# Patient Record
Sex: Female | Born: 1985 | Race: White | Hispanic: No | Smoking: Current every day smoker
Health system: Southern US, Community
[De-identification: ages and names within clinical notes are randomized; demographics above are authoritative.]

## PROBLEM LIST (undated history)

## (undated) ENCOUNTER — Inpatient Hospital Stay (HOSPITAL_COMMUNITY): Payer: Self-pay

## (undated) DIAGNOSIS — F419 Anxiety disorder, unspecified: Secondary | ICD-10-CM

## (undated) DIAGNOSIS — M5430 Sciatica, unspecified side: Secondary | ICD-10-CM

## (undated) DIAGNOSIS — M549 Dorsalgia, unspecified: Secondary | ICD-10-CM

## (undated) DIAGNOSIS — F32A Depression, unspecified: Secondary | ICD-10-CM

## (undated) DIAGNOSIS — C539 Malignant neoplasm of cervix uteri, unspecified: Secondary | ICD-10-CM

## (undated) DIAGNOSIS — K5090 Crohn's disease, unspecified, without complications: Secondary | ICD-10-CM

## (undated) DIAGNOSIS — F329 Major depressive disorder, single episode, unspecified: Secondary | ICD-10-CM

## (undated) DIAGNOSIS — G8929 Other chronic pain: Secondary | ICD-10-CM

## (undated) HISTORY — PX: ABDOMINAL HYSTERECTOMY: SHX81

## (undated) HISTORY — PX: BUNIONECTOMY: SHX129

## (undated) HISTORY — PX: MOUTH SURGERY: SHX715

## (undated) HISTORY — PX: CHOLECYSTECTOMY: SHX55

## (undated) HISTORY — DX: Malignant neoplasm of cervix uteri, unspecified: C53.9

## (undated) HISTORY — PX: HAND SURGERY: SHX662

---

## 2004-06-01 ENCOUNTER — Inpatient Hospital Stay (HOSPITAL_COMMUNITY): Admission: EM | Admit: 2004-06-01 | Discharge: 2004-06-02 | Payer: Self-pay | Admitting: Emergency Medicine

## 2007-03-10 ENCOUNTER — Emergency Department (HOSPITAL_COMMUNITY): Admission: EM | Admit: 2007-03-10 | Discharge: 2007-03-10 | Payer: Self-pay | Admitting: Emergency Medicine

## 2007-04-02 ENCOUNTER — Ambulatory Visit: Payer: Self-pay | Admitting: Gastroenterology

## 2007-11-07 ENCOUNTER — Emergency Department (HOSPITAL_COMMUNITY): Admission: EM | Admit: 2007-11-07 | Discharge: 2007-11-07 | Payer: Self-pay | Admitting: Emergency Medicine

## 2007-12-24 ENCOUNTER — Emergency Department (HOSPITAL_COMMUNITY): Admission: EM | Admit: 2007-12-24 | Discharge: 2007-12-25 | Payer: Self-pay | Admitting: Emergency Medicine

## 2008-07-27 ENCOUNTER — Emergency Department (HOSPITAL_COMMUNITY): Admission: EM | Admit: 2008-07-27 | Discharge: 2008-07-27 | Payer: Self-pay | Admitting: Emergency Medicine

## 2008-10-02 ENCOUNTER — Emergency Department (HOSPITAL_COMMUNITY): Admission: EM | Admit: 2008-10-02 | Discharge: 2008-10-02 | Payer: Self-pay | Admitting: Emergency Medicine

## 2008-10-03 ENCOUNTER — Emergency Department (HOSPITAL_COMMUNITY): Admission: EM | Admit: 2008-10-03 | Discharge: 2008-10-03 | Payer: Self-pay | Admitting: Emergency Medicine

## 2009-05-22 ENCOUNTER — Encounter: Payer: Self-pay | Admitting: Gastroenterology

## 2010-03-13 NOTE — Letter (Signed)
Summary: DISABILITY CLAIM  DISABILITY CLAIM   Imported By: Diana Eves 05/22/2009 11:52:25  _____________________________________________________________________  External Attachment:    Type:   Image     Comment:   External Document

## 2010-05-19 LAB — SALICYLATE LEVEL: Salicylate Lvl: <4 mg/dL (ref 2.8–20.0)

## 2010-05-19 LAB — URINALYSIS, ROUTINE W REFLEX MICROSCOPIC
Bilirubin Urine: NEGATIVE
Ketones, ur: NEGATIVE mg/dL
Nitrite: NEGATIVE
Protein, ur: NEGATIVE mg/dL
Specific Gravity, Urine: <1.005 — ABNORMAL LOW (ref 1.005–1.030)
Urobilinogen, UA: 0.2 mg/dL (ref 0.0–1.0)

## 2010-05-19 LAB — BASIC METABOLIC PANEL
CO2: 25 mEq/L (ref 19–32)
Chloride: 109 mEq/L (ref 96–112)
Glucose, Bld: 80 mg/dL (ref 70–99)
Potassium: 3.2 mEq/L — ABNORMAL LOW (ref 3.5–5.1)
Sodium: 140 mEq/L (ref 135–145)

## 2010-05-19 LAB — HCG, QUANTITATIVE, PREGNANCY: hCG, Beta Chain, Quant, S: <2 m[IU]/mL (ref <5)

## 2010-05-19 LAB — URINE MICROSCOPIC-ADD ON

## 2010-05-19 LAB — CBC
MCHC: 34.9 g/dL (ref 30.0–36.0)
MCV: 91.4 fL (ref 78.0–100.0)

## 2010-05-19 LAB — DIFFERENTIAL
Basophils Absolute: 0 10*3/uL (ref 0.0–0.1)
Basophils Relative: 0 % (ref 0–1)
Eosinophils Relative: 1 % (ref 0–5)
Lymphocytes Relative: 28 % (ref 12–46)
Monocytes Absolute: 0.4 10*3/uL (ref 0.1–1.0)
Neutro Abs: 4.9 10*3/uL (ref 1.7–7.7)
Neutrophils Relative %: 66 % (ref 43–77)

## 2010-05-19 LAB — RAPID URINE DRUG SCREEN, HOSP PERFORMED: Cocaine: POSITIVE — AB

## 2010-06-26 NOTE — Assessment & Plan Note (Signed)
NAMESTEELE, LEDONNE               CHART#:  [id number]   DATE:  04/02/2007                       DOB:  [birth year]   REFERRING PHYSICIAN:  Ivery Quale, P.A. with Dr. Dione Booze.   REASON FOR CONSULTATION:  Abdominal pain and diarrhea.   HISTORY OF PRESENT ILLNESS:  The patient is a 25 year old female who has  had abdominal pain since before Christmas.  She was seen by OB/GYN and  per her report was diagnosed with endometriosis.  She was placed on  birth control pills approximately four months ago and has had no change  in her lower abdominal pain.  The abdominal pain can be associated with  lower leg cramping.  She has had a maximum temperature of 100 degrees  Fahrenheit.  The pain is a stabbing pain in her lower abdomen that does  not radiate.  She has had no formed stool since she had her oldest child  four years ago.  When she has the pain and goes to the bathroom, she  states that she has vaginal bleeding.  She is having some burning with  urination, but had a urinalysis in the emergency department in January  that showed no evidence of cystitis.  She denies any heartburn,  indigestion, or problems swallowing.  She states that her lower  abdominal pain is constant.  Her last visit to OB/GYN was two months  ago.  She says her appetite is decreased and she has lost 10 pounds over  the last two months.   PAST MEDICAL HISTORY:  1. Blepharitis.  2. Endometriosis and irregular menses.   PAST SURGICAL HISTORY:  1. Mouth surgery.  2. Cholecystectomy in May of 2007 because her gallbladder quit      working.   ALLERGIES:  PAXIL causes hallucinations.   MEDICATIONS:  1. Percocet as needed.  2. Doxycycline 100 mg b.i.d.  3. Sprintec daily.  4. Phenergan as needed.   SOCIAL HISTORY:  She has two children at 30 and 2 and she is married.  Her husband accompanies her today to the visit and remains in the room  with his sunglasses on.  She smokes.  She denies any alcohol use.  She  has occasionally used marijuana.   REVIEW OF SYSTEMS:  As per the HPI, otherwise all systems are negative.   PHYSICAL EXAMINATION:  VITAL SIGNS:  Weight 130 pounds, height 5 feet 8  inches, BMI 19.8 (slightly underweight), temperature 98.3, blood  pressure 130/80, pulse 72.  GENERAL:  She is in no apparent distress,  alert and oriented x4.  She has a look as if she is in pain, but does  not appear to be uncomfortable.  HEENT:  Normocephalic and atraumatic.  Pupils equal, round, and reactive to light.  Mouth; no oral lesions.  Sclerae anicteric. NECK:  Full range of motion with no lymphadenopathy.  LUNGS:  Clear to auscultation bilaterally.  CARDIOVASCULAR:  Regular  rhythm with no murmur.  ABDOMEN:  Bowel sounds are present, soft,  nondistended.  With deep palpation with the stethoscope she shows no  discomfort at all.  When examined with my hand, then she becomes  uncomfortable.  When asked if it hurt worse when I used the stethoscope  or my hand, she said the hand hurt worse even though the amount of  palpation was the  same.  She had no rebound or guarding. EXTREMITIES:  No cyanosis or edema. NEURO:  She has no focal neurologic deficit.   ASSESSMENT:  The patient is a 25 year old female with lower abdominal  pain associated with bowel irregularity.  She does have loose stool  frequently.  The differential diagnosis includes irritable bowel  syndrome, or bile salt induced diarrhea.  Her disease seems to be  complicated by endometriosis with a possibility of implants on her bowel  wall causing bowel irregularity and irritation.  Thank you for allowing  me to see this patient in consultation.   RECOMMENDATIONS:  1. She needs to follow up with Ernestina Penna, M.D. at the [facility] for her endometriosis.  I think a significant portion of her      abdominal pain is related to her GYN system.  She may have some      small component of irritable bowel.  2. She is to add  Bentyl 10 mg four times a day as well as Colestid 1      gram daily.  If after seven days her diarrhea persists, then she is      to add two tablets of Colestid.  3. She should call me in two weeks to let me know how she is doing.  4. She is given a prescription for Phenergan and asked to use it as      needed for nausea and vomiting.  5. She has an outpatient visit in six weeks.  We will readdress her      diarrhea and place her on a low fat diet if things are not      significantly improved.       Kassie Mends, M.D.  Electronically Signed     SM/MEDQ  D:  04/02/2007  T:  04/03/2007  Job:  [id number]   cc:   Ernestina Penna

## 2010-06-29 NOTE — Discharge Summary (Signed)
Katherine Mack, Katherine Mack NO.:  [id number]   MEDICAL RECORD NO.:  [id number]          PATIENT TYPE:  INP   LOCATION:  A303                          FACILITY:  [facility]   PHYSICIAN:  Osvaldo Shipper, MD     DATE OF BIRTH:  [birth year]   DATE OF ADMISSION:  06/01/2004  DATE OF DISCHARGE:  04/22/2006LH                                 DISCHARGE SUMMARY   Please note the patient left AGAINST MEDICAL ADVICE on April 22nd.   PRIMARY CARE PHYSICIAN:  Dr. Margo Common.   Please review the H&P dictated on April 21st for details regarding the  patient's presenting illness.   BRIEF HOSPITAL COURSE:  The patient was admitted with severe lower back  pain, some difficulty with passing urine, low grade fever, and elevated  white count. The patient underwent a CAT scan which showed edema in  bilateral pelvises. Her UA was also positive for a urinary tract infection.  The patient was started on antibiotics in the form of levofloxacin.  Initial  diagnosis of acute pyelonephritis was given to the patient. Other  differentials including musculoskeletal etiology were also entertained  because of her presenting history. The patient underwent x-rays of her  lumbosacral spine which do not show any acute abnormality.   On the second hospital day I saw the patient, her symptoms had improved  slightly. She was able to ambulate to the bathroom with some assistance. The  plan was to continue with the antibiotics and see that the patient improves.  She was having bilateral CVA tenderness. There was no evidence for any  radiculopathy in this patient. ESR was obtained which was 30. The plan with  this patient was to continue with her antibiotics and have her stay in the  hospital at least one more day to see if her symptoms resolve or not. If the  symptoms are not resolved, consider further testing to evaluate his back  pain. However, later in the course of the day the patient really wanted to  leave the  hospital. She was advised not to do so, however she did sign  against medical advice and left the hospital.   MEDICATIONS:  I did call in a prescription for levofloxacin for a total of 12 days to her  pharmacy, which is [facility]. I also called in a prescription for  Toradol for about three days for her pain.   If the patient has recurrence of her pain she needs to return to the  emergency department. Further discharge instruction could not be  communicated as the patient has left the hospital.      GK/MEDQ  D:  06/03/2004  T:  06/03/2004  Job:  [id number]   cc:   Wyvonnia Lora  [address]  [place]  [place] [address]  Fax: [phone]

## 2010-06-29 NOTE — H&P (Signed)
Katherine Mack, Katherine Mack           ACCOUNT NO.:  [id number]   MEDICAL RECORD NO.:  [id number]          PATIENT TYPE:  INP   LOCATION:  A303                          FACILITY:  [facility]   PHYSICIAN:  Osvaldo Shipper, MD     DATE OF BIRTH:  10/06/1985   DATE OF ADMISSION:  06/01/2004  DATE OF DISCHARGE:  LH                                HISTORY & PHYSICAL   PRIMARY CARE PHYSICIAN:  Dr. Wyvonnia Lora.   ADMISSION DIAGNOSIS:  Back pain since midnight.  Query pyelonephritis.  Query musculoskeletal.   CHIEF COMPLAINT:  Back pain.   HISTORY OF PRESENT ILLNESS:  Patient is a 25 year old Caucasian female with  no medical problems in the past, who presents with onset of back pain since  midnight.  Patient states that she was getting up from a squatting position  when she heard a 'pop' in the back.  She did not experience any acute pain  at that time.  She mentioned after a while, the patient was a 3/10 in  intensity.  She went to bed but could not sleep because of the discomfort in  the back.  The patient subsequently woke up in the morning with about 10/10  pain.  The pain was described as being sharp in nature.  It was present, and  she was pointing to both her flanks with radiating down to the hip,  according to the patient.  The pain is aggravated by deep breathing,  coughing, and any kind of movement.  It is partially relieved with rest, and  the pain medication that she has been getting.  The patient also describes  some pain with urination.  She did not mention any blood in her urine.  She  did not mention any change in the color of her urine.  No change in the  smell of her urine either.  Patient denies any fever at home.  The patient  has never experienced similar symptoms in the past.  She did have some  nausea and had one episode of emesis this morning.  There is no history of  any abdominal pain; however, the patient did mention that she was having her  periods at this time and  associated with the period, she was having some  cramping sensation in the lower abdomen.   MEDICATIONS:  Patient is on no medications at this time.   ALLERGIES:  Allergic to PAXIL, which causes hallucinations.   PAST MEDICAL HISTORY:  She had a miscarriage in 2005.  She had a spinal tap  sometime last year for suspected meningitis.   SOCIAL HISTORY:  Patient lives with her mother and daughter.  She works as a  Child psychotherapist.  She smokes about a half pack per day and has been smoking for  over six years.  No history of alcohol use.  No history of any illegal drug  use.   FAMILY HISTORY:  Her mother had some lung cysts.  Father had back surgery.  Otherwise, a history of diabetes, breast cancer, and lung cancer in the  family.   REVIEW OF SYSTEMS:  A 10-point review  of systems was done, which did not  elicit any positive findings.  Specifically, there was no tingling or  numbness in her lower extremities.  The pain did not radiate to her legs.  Not given any history of urinary or bowel incontinence.   PHYSICAL EXAMINATION:  VITAL SIGNS:  Temperature 100, blood pressure 137/81,  heart rate 89, respiratory rate 16.  Pulse ox about 99% on room air.  GENERAL:  A thin white female in moderate discomfort secondary to pain.  HEENT:  There is no pallor or icterus.  Oral mucous membranes are moist.  No  lesions seen.  NECK:  Soft and supple.  LUNGS:  Clear to auscultation bilaterally.  CARDIOVASCULAR:  Normal.  Regular.  No murmurs appreciated.  ABDOMEN:  There is diffuse, slight tenderness.  There is some guarding;  however, there is no rigidity or rebound.  No mass or organomegaly is  appreciated.  Bowel sounds are heard.  The tenderness is more so in the  lower abdomen.  EXTREMITIES:  Without any edema and pulses are palpable.  MUSCULOSKELETAL:  Patient does have tenderness over her lumbosacral spine.  She did have marked tenderness over her CVA bilaterally.  The patient was  crying during  most of her examination because of her significant pain.  I  did try to raise her legs, which caused pain in the back; however, did not  radiate down, hence her SLR was negative.  Gait was not assessed, as the  patient refused cooperation at this time.  NEUROLOGIC:  Cranial nerves were intact.  Sensory exam was intact.  Motor  strength was 5/5, upper and lower extremities.  Reflexes were 1+  bilaterally, lower extremities.  Gait not assessed because of lack of  patient cooperation.   LABS:  White count 16.5 with neutrophils of 87%.  Hemoglobin 13.8, platelet  count 285.   Sodium 136, potassium 3.8, chloride 105, bicarb 27, glucose 101, BUN 7,  creatinine 0.7, calcium 9.  Urine beta HCG was negative.   UA showed specific gravity of 1.025.  The appearance was hazy.  The pH was  7.  Trace blood was seen.  Nitrites and leukocytes were negative; however,  21-50 WBCs were seen.  Many bacteria were seen.   IMAGING STUDIES:  Patient had a CAT scan of her abdomen and pelvis that  showed thickening of the walls of the renal pelvis and proximal ureters  bilaterally.  No evidence for appendicitis was found.  No other  abnormalities seen, as per radiology interpretation.   IMPRESSION:  This is a 25 year old white female who presents with back pain  since about midnight.  Etiology most likely is secondary to pyelonephritis,  considering her mild fever, elevated white count, and a positive urinalysis;  however, the patient mentions this history of pain which started while she  was getting up from a squatting position.  It is difficult, to be sure, if  she has any musculoskeletal element to this pain.  Since the patient is  having significant discomfort, musculoskeletal exam is very limited.   PLAN:  Back pain:  Considering the patient has positive signs and symptoms  for UTI, we will start the patient on levofloxacin.  Patient also received one dose in the emergency room.  We will obtain urine  cultures and blood  cultures.  We will obtain an x-ray of her lumbosacral to rule out any  musculoskeletal problems.  We will put the  patient on Toradol and Dilaudid on a  p.r.n. basis.  We will hydrate the  patient with IV fluids.  We will put her on a diet at this time.   If the patient does not respond with this regimen, we will consider further  testing.      GK/MEDQ  D:  06/01/2004  T:  06/01/2004  Job:  [id number]   cc:   Wyvonnia Lora  [address]  [place]  [place] [address]  Fax: [phone]

## 2010-11-01 LAB — URINE MICROSCOPIC-ADD ON

## 2010-11-01 LAB — PREGNANCY, URINE: Preg Test, Ur: NEGATIVE

## 2010-11-01 LAB — URINALYSIS, ROUTINE W REFLEX MICROSCOPIC
Ketones, ur: NEGATIVE
Nitrite: NEGATIVE
Urobilinogen, UA: 0.2

## 2010-11-12 LAB — DIFFERENTIAL
Basophils Absolute: 0.1
Eosinophils Absolute: 0.1
Eosinophils Relative: 1

## 2010-11-12 LAB — BASIC METABOLIC PANEL
BUN: 4 — ABNORMAL LOW
Chloride: 104
Glucose, Bld: 97
Potassium: 4

## 2010-11-12 LAB — CBC
HCT: 39.4
MCHC: 35.1
MCV: 93.5
Platelets: 350
RDW: 13.7

## 2010-11-12 LAB — D-DIMER, QUANTITATIVE: D-Dimer, Quant: 3.12 — ABNORMAL HIGH

## 2010-11-13 LAB — D-DIMER, QUANTITATIVE: D-Dimer, Quant: 2.8 — ABNORMAL HIGH

## 2011-11-19 ENCOUNTER — Other Ambulatory Visit (HOSPITAL_COMMUNITY)
Admission: RE | Admit: 2011-11-19 | Discharge: 2011-11-19 | Disposition: A | Discharge status: Home / Self Care | Payer: Self-pay | Source: Ambulatory Visit | Attending: Unknown Physician Specialty | Admitting: Unknown Physician Specialty

## 2011-11-19 ENCOUNTER — Other Ambulatory Visit: Payer: Self-pay | Admitting: Nurse Practitioner

## 2011-11-19 DIAGNOSIS — D069 Carcinoma in situ of cervix, unspecified: Secondary | ICD-10-CM | POA: Insufficient documentation

## 2011-11-19 DIAGNOSIS — R87613 High grade squamous intraepithelial lesion on cytologic smear of cervix (HGSIL): Secondary | ICD-10-CM | POA: Insufficient documentation

## 2011-11-19 DIAGNOSIS — N72 Inflammatory disease of cervix uteri: Secondary | ICD-10-CM | POA: Insufficient documentation

## 2012-07-22 ENCOUNTER — Emergency Department (HOSPITAL_COMMUNITY)
Admission: EM | Admit: 2012-07-22 | Discharge: 2012-07-22 | Disposition: A | Discharge status: Home / Self Care | Payer: Self-pay | Attending: Emergency Medicine | Admitting: Emergency Medicine

## 2012-07-22 ENCOUNTER — Encounter (HOSPITAL_COMMUNITY): Payer: Self-pay | Admitting: Emergency Medicine

## 2012-07-22 ENCOUNTER — Emergency Department (HOSPITAL_COMMUNITY): Payer: Self-pay

## 2012-07-22 DIAGNOSIS — M5416 Radiculopathy, lumbar region: Secondary | ICD-10-CM

## 2012-07-22 DIAGNOSIS — M79609 Pain in unspecified limb: Secondary | ICD-10-CM | POA: Insufficient documentation

## 2012-07-22 DIAGNOSIS — IMO0002 Reserved for concepts with insufficient information to code with codable children: Secondary | ICD-10-CM | POA: Insufficient documentation

## 2012-07-22 DIAGNOSIS — Z79899 Other long term (current) drug therapy: Secondary | ICD-10-CM | POA: Insufficient documentation

## 2012-07-22 DIAGNOSIS — F17200 Nicotine dependence, unspecified, uncomplicated: Secondary | ICD-10-CM | POA: Insufficient documentation

## 2012-07-22 DIAGNOSIS — F3289 Other specified depressive episodes: Secondary | ICD-10-CM | POA: Insufficient documentation

## 2012-07-22 DIAGNOSIS — Z3202 Encounter for pregnancy test, result negative: Secondary | ICD-10-CM | POA: Insufficient documentation

## 2012-07-22 DIAGNOSIS — F411 Generalized anxiety disorder: Secondary | ICD-10-CM | POA: Insufficient documentation

## 2012-07-22 DIAGNOSIS — F329 Major depressive disorder, single episode, unspecified: Secondary | ICD-10-CM | POA: Insufficient documentation

## 2012-07-22 HISTORY — DX: Sciatica, unspecified side: M54.30

## 2012-07-22 HISTORY — DX: Anxiety disorder, unspecified: F41.9

## 2012-07-22 HISTORY — DX: Major depressive disorder, single episode, unspecified: F32.9

## 2012-07-22 HISTORY — DX: Depression, unspecified: F32.A

## 2012-07-22 LAB — URINALYSIS, ROUTINE W REFLEX MICROSCOPIC
Bilirubin Urine: NEGATIVE
Nitrite: NEGATIVE
Specific Gravity, Urine: 1.015 (ref 1.005–1.030)
pH: 7 (ref 5.0–8.0)

## 2012-07-22 MED ORDER — HYDROCODONE-ACETAMINOPHEN 7.5-325 MG PO TABS: 1.0000 | ORAL_TABLET | Freq: Four times a day (QID) | ORAL | Status: DC | PRN

## 2012-07-22 MED ORDER — NAPROXEN 500 MG PO TABS: 500.0000 mg | ORAL_TABLET | Freq: Two times a day (BID) | ORAL | Status: DC

## 2012-07-22 MED ORDER — HYDROCODONE-ACETAMINOPHEN 5-325 MG PO TABS
1.0000 | ORAL_TABLET | Freq: Once | ORAL | Status: AC
  Administered 2012-07-22: 1 via ORAL
  Filled 2012-07-22: qty 1

## 2012-07-22 MED ORDER — CYCLOBENZAPRINE HCL 10 MG PO TABS: 10.0000 mg | ORAL_TABLET | Freq: Two times a day (BID) | ORAL | Status: DC | PRN

## 2012-07-22 NOTE — ED Notes (Signed)
Pt c/o left side back/hip pain x 3 days.

## 2012-07-22 NOTE — ED Notes (Signed)
Pt presents with left lower back pain, denies injury/trauma and history of said pain to the left side. Pain has worsened over past 24 hrs with radiating pain to left leg. Denies bowel/bladder incontinence.

## 2012-07-25 NOTE — ED Provider Notes (Signed)
History     CSN: [id number]  Arrival date & time 07/22/12  1215   First MD Initiated Contact with Patient 07/22/12 1255      Chief Complaint  Patient presents with  . Back Pain    (Consider location/radiation/quality/duration/timing/severity/associated sxs/prior treatment) Patient is a 27 y.o. female presenting with back pain. The history is provided by the patient.  Back Pain Location:  Lumbar spine Quality:  Aching Radiates to:  L thigh (left hip and buttocks) Pain severity:  Moderate Pain is:  Same all the time Onset quality:  Gradual Duration:  3 days Timing:  Constant Progression:  Worsening Chronicity:  New Context: not recent injury   Context comment:  No known injury Relieved by: certain positions. Worsened by:  Bending, ambulation, standing, twisting and sitting Ineffective treatments:  None tried Associated symptoms: leg pain   Associated symptoms: no abdominal pain, no abdominal swelling, no bladder incontinence, no bowel incontinence, no chest pain, no dysuria, no fever, no headaches, no numbness, no paresthesias, no pelvic pain, no perianal numbness, no tingling and no weakness     Past Medical History  Diagnosis Date  . Sciatica   . Depression   . Anxiety     Past Surgical History  Procedure Laterality Date  . Bunionectomy    . Cholecystectomy    . Mouth surgery      No family history on file.  History  Substance Use Topics  . Smoking status: Current Every Day Smoker -- 0.25 packs/day  . Smokeless tobacco: Not on file  . Alcohol Use: No    OB History   Grav Para Term Preterm Abortions TAB SAB Ect Mult Living                  Review of Systems  Constitutional: Negative for fever and chills.  Respiratory: Negative for shortness of breath.   Cardiovascular: Negative for chest pain.  Gastrointestinal: Negative for vomiting, abdominal pain, constipation and bowel incontinence.  Genitourinary: Negative for bladder incontinence, dysuria,  hematuria, flank pain, decreased urine volume, difficulty urinating and pelvic pain.       No perineal numbness or incontinence of urine or feces  Musculoskeletal: Positive for back pain. Negative for joint swelling.  Skin: Negative for rash.  Neurological: Negative for tingling, weakness, numbness, headaches and paresthesias.  All other systems reviewed and are negative.    Allergies  Onion; Paxil; and Prednisone  Home Medications   Current Outpatient Rx  Name  Route  Sig  Dispense  Refill  . FLUoxetine (PROZAC) 10 MG capsule   Oral   Take 10 mg by mouth daily.         . traZODone (DESYREL) 150 MG tablet   Oral   Take 450 mg by mouth at bedtime.         . cyclobenzaprine (FLEXERIL) 10 MG tablet   Oral   Take 1 tablet (10 mg total) by mouth 2 (two) times daily as needed for muscle spasms.   20 tablet   0   . HYDROcodone-acetaminophen (NORCO) 7.5-325 MG per tablet   Oral   Take 1 tablet by mouth every 6 (six) hours as needed for pain.   15 tablet   0   . naproxen (NAPROSYN) 500 MG tablet   Oral   Take 1 tablet (500 mg total) by mouth 2 (two) times daily.   20 tablet   0     BP 124/83  Pulse 84  Temp(Src) 98.1 F (36.7  C) (Oral)  Resp 20  Ht 5\' 8"  (1.727 m)  Wt 144 lb (65.318 kg)  BMI 21.9 kg/m2  SpO2 97%  LMP 06/07/2012  Physical Exam  Nursing note and vitals reviewed. Constitutional: She is oriented to person, place, and time. She appears well-developed and well-nourished. No distress.  HENT:  Head: Normocephalic and atraumatic.  Neck: Normal range of motion. Neck supple.  Cardiovascular: Normal rate, regular rhythm, normal heart sounds and intact distal pulses.   No murmur heard. Pulmonary/Chest: Effort normal and breath sounds normal. No respiratory distress.  Abdominal: Soft. She exhibits no distension. There is no tenderness. There is no rebound.  Musculoskeletal: She exhibits tenderness. She exhibits no edema.       Lumbar back: She exhibits  tenderness and pain. She exhibits normal range of motion, no swelling, no deformity, no laceration and normal pulse.  ttp of the left lumbar spine, paraspinal muscles and SI joint.   DP pulses are brisk and symmetrical.  Distal sensation intact.  Hip Flexors/Extensors are intact. No distal edema or discoloration.    Neurological: She is alert and oriented to person, place, and time. No cranial nerve deficit or sensory deficit. She exhibits normal muscle tone. Coordination and gait normal.  Reflex Scores:      Patellar reflexes are 2+ on the right side and 2+ on the left side.      Achilles reflexes are 2+ on the right side and 2+ on the left side. Skin: Skin is warm and dry.    ED Course  Procedures (including critical care time)   Results for orders placed during the hospital encounter of 07/22/12  URINALYSIS, ROUTINE W REFLEX MICROSCOPIC      Result Value Range   Color, Urine YELLOW  YELLOW   APPearance CLEAR  CLEAR   Specific Gravity, Urine 1.015  1.005 - 1.030   pH 7.0  5.0 - 8.0   Glucose, UA NEGATIVE  NEGATIVE mg/dL   Hgb urine dipstick NEGATIVE  NEGATIVE   Bilirubin Urine NEGATIVE  NEGATIVE   Ketones, ur NEGATIVE  NEGATIVE mg/dL   Protein, ur NEGATIVE  NEGATIVE mg/dL   Urobilinogen, UA 0.2  0.0 - 1.0 mg/dL   Nitrite NEGATIVE  NEGATIVE   Leukocytes, UA NEGATIVE  NEGATIVE  POCT PREGNANCY, URINE      Result Value Range   Preg Test, Ur NEGATIVE  NEGATIVE    Dg Lumbar Spine Complete  07/22/2012   *RADIOLOGY REPORT*  Clinical Data: Back pain.  LUMBAR SPINE - COMPLETE 4+ VIEW  Comparison: 11/07/2007  Findings: AP, lateral and oblique images of the lumbar spine were obtained.  Again noted are cholecystectomy clips.  Stable alignment of the lumbar spine.  Vertebral body heights are maintained.  Mild disc space narrowing at L5-S1 has not significantly changed.  IMPRESSION: No acute bony abnormalities in the lumbar spine.  Stable mild disc space narrowing at L5-S1.   Original Report  Authenticated By: Richarda Overlie, M.D.      1. Lumbar radicular pain       MDM    Patient has ttp of the left lumbar spine, paraspinal muscles and SI joint.  No focal neuro deficits on exam.  Ambulates with a steady gait.  Doubt emergent neurological or infectious process.  VSS.  Patient is stable for discharge.  Agrees to close f/u with her PMD or return here if sx's are worsening       Kayler Rise L. Trisha Mangle, PA-C 07/25/12 1759

## 2012-08-02 NOTE — ED Provider Notes (Signed)
Medical screening examination/treatment/procedure(s) were performed by non-physician practitioner and as supervising physician I was immediately available for consultation/collaboration.   Havilah Topor L Deckard Stuber, MD 08/02/12 0254 

## 2013-01-15 ENCOUNTER — Emergency Department (HOSPITAL_COMMUNITY)
Admission: EM | Admit: 2013-01-15 | Discharge: 2013-01-15 | Disposition: A | Discharge status: Home / Self Care | Payer: Medicaid Other | Attending: Emergency Medicine | Admitting: Emergency Medicine

## 2013-01-15 ENCOUNTER — Encounter (HOSPITAL_COMMUNITY): Payer: Self-pay | Admitting: Emergency Medicine

## 2013-01-15 ENCOUNTER — Emergency Department (HOSPITAL_COMMUNITY): Payer: Medicaid Other

## 2013-01-15 DIAGNOSIS — A499 Bacterial infection, unspecified: Secondary | ICD-10-CM | POA: Insufficient documentation

## 2013-01-15 DIAGNOSIS — F329 Major depressive disorder, single episode, unspecified: Secondary | ICD-10-CM | POA: Insufficient documentation

## 2013-01-15 DIAGNOSIS — R059 Cough, unspecified: Secondary | ICD-10-CM | POA: Insufficient documentation

## 2013-01-15 DIAGNOSIS — R21 Rash and other nonspecific skin eruption: Secondary | ICD-10-CM | POA: Insufficient documentation

## 2013-01-15 DIAGNOSIS — Z331 Pregnant state, incidental: Secondary | ICD-10-CM | POA: Insufficient documentation

## 2013-01-15 DIAGNOSIS — Z3490 Encounter for supervision of normal pregnancy, unspecified, unspecified trimester: Secondary | ICD-10-CM

## 2013-01-15 DIAGNOSIS — R110 Nausea: Secondary | ICD-10-CM | POA: Insufficient documentation

## 2013-01-15 DIAGNOSIS — F411 Generalized anxiety disorder: Secondary | ICD-10-CM | POA: Insufficient documentation

## 2013-01-15 DIAGNOSIS — F17200 Nicotine dependence, unspecified, uncomplicated: Secondary | ICD-10-CM | POA: Insufficient documentation

## 2013-01-15 DIAGNOSIS — B9689 Other specified bacterial agents as the cause of diseases classified elsewhere: Secondary | ICD-10-CM | POA: Insufficient documentation

## 2013-01-15 DIAGNOSIS — R05 Cough: Secondary | ICD-10-CM | POA: Insufficient documentation

## 2013-01-15 DIAGNOSIS — Z8719 Personal history of other diseases of the digestive system: Secondary | ICD-10-CM | POA: Insufficient documentation

## 2013-01-15 DIAGNOSIS — Z8739 Personal history of other diseases of the musculoskeletal system and connective tissue: Secondary | ICD-10-CM | POA: Insufficient documentation

## 2013-01-15 DIAGNOSIS — R079 Chest pain, unspecified: Secondary | ICD-10-CM | POA: Insufficient documentation

## 2013-01-15 DIAGNOSIS — F3289 Other specified depressive episodes: Secondary | ICD-10-CM | POA: Insufficient documentation

## 2013-01-15 DIAGNOSIS — N760 Acute vaginitis: Secondary | ICD-10-CM | POA: Insufficient documentation

## 2013-01-15 DIAGNOSIS — Z79899 Other long term (current) drug therapy: Secondary | ICD-10-CM | POA: Insufficient documentation

## 2013-01-15 HISTORY — DX: Crohn's disease, unspecified, without complications: K50.90

## 2013-01-15 LAB — URINALYSIS, ROUTINE W REFLEX MICROSCOPIC
Glucose, UA: NEGATIVE mg/dL
Leukocytes, UA: NEGATIVE
Nitrite: NEGATIVE
Protein, ur: NEGATIVE mg/dL
Urobilinogen, UA: 0.2 mg/dL (ref 0.0–1.0)

## 2013-01-15 LAB — WET PREP, GENITAL: Trich, Wet Prep: NONE SEEN

## 2013-01-15 LAB — COMPREHENSIVE METABOLIC PANEL
ALT: 9 U/L (ref 0–35)
Albumin: 4 g/dL (ref 3.5–5.2)
Alkaline Phosphatase: 44 U/L (ref 39–117)
Calcium: 9.3 mg/dL (ref 8.4–10.5)
Potassium: 4.1 mEq/L (ref 3.5–5.1)
Sodium: 136 mEq/L (ref 135–145)
Total Protein: 7 g/dL (ref 6.0–8.3)

## 2013-01-15 LAB — CBC WITH DIFFERENTIAL/PLATELET
Basophils Absolute: 0 10*3/uL (ref 0.0–0.1)
Basophils Relative: 0 % (ref 0–1)
Eosinophils Absolute: 0.1 10*3/uL (ref 0.0–0.7)
MCH: 32.6 pg (ref 26.0–34.0)
MCHC: 35.1 g/dL (ref 30.0–36.0)
Neutrophils Relative %: 64 % (ref 43–77)
Platelets: 264 10*3/uL (ref 150–400)
RBC: 3.74 MIL/uL — ABNORMAL LOW (ref 3.87–5.11)
RDW: 12.4 % (ref 11.5–15.5)

## 2013-01-15 LAB — PREGNANCY, URINE: Preg Test, Ur: POSITIVE — AB

## 2013-01-15 MED ORDER — PRENATAL COMPLETE 14-0.4 MG PO TABS: 1.0000 | ORAL_TABLET | Freq: Every day | ORAL | Status: DC

## 2013-01-15 MED ORDER — METRONIDAZOLE 500 MG PO TABS: 500.0000 mg | ORAL_TABLET | Freq: Two times a day (BID) | ORAL | Status: DC

## 2013-01-15 NOTE — ED Notes (Signed)
Pt c/o cough, nausea, and rash x1-2 weeks. Pt states cough is productive with "thick grey sputum". Pt's rash is throughout torso. Lung sounds are clear.

## 2013-01-15 NOTE — ED Provider Notes (Signed)
CSN: [id number]     Arrival date & time 01/15/13  1033 History  This chart was scribed for Glynn Octave, MD by Leone Payor, ED Scribe. This patient was seen in room APA06/AP[facility]06 and the patient's care was started 11:53 AM.    Chief Complaint  Patient presents with  . Cough  . Abdominal Pain    The history is provided by the patient. No language interpreter was used.    HPI Comments: Katherine Mack is a 27 y.o. female who presents to the Emergency Department complaining of 7 days of gradual onset, gradually worsening, constant cough productive of thick gray sputum. She also reports having associated nasal congestion, chest pain with coughing, and nausea. She denies using birth control. Her LNMP was end of October. She denies vomiting, SOB. She has history of cholecystectomy.   Pt also reports having an intermittent rash to the chest, breasts, and right leg. She describes it as itchy bumps. She denies using any new soaps or detergents. She denies family or friends with similar symptoms.   Past Medical History  Diagnosis Date  . Sciatica   . Depression   . Anxiety   . Crohn's disease    Past Surgical History  Procedure Laterality Date  . Bunionectomy    . Cholecystectomy    . Mouth surgery     No family history on file. History  Substance Use Topics  . Smoking status: Current Every Day Smoker -- 0.25 packs/day  . Smokeless tobacco: Not on file  . Alcohol Use: No   OB History   Grav Para Term Preterm Abortions TAB SAB Ect Mult Living                 Review of Systems A complete 10 system review of systems was obtained and all systems are negative except as noted in the HPI and PMH.   Allergies  Onion; Paxil; and Prednisone  Home Medications   Current Outpatient Rx  Name  Route  Sig  Dispense  Refill  . acetaminophen (TYLENOL) 500 MG tablet   Oral   Take 1,000 mg by mouth 4 (four) times daily as needed for headache.         Marland Kitchen FLUoxetine (PROZAC) 20 MG capsule    Oral   Take 20 mg by mouth daily.         . Fluticasone-Salmeterol (ADVAIR) 250-50 MCG/DOSE AEPB   Inhalation   Inhale 2 puffs into the lungs daily as needed (shortness of breath & cough.).         Marland Kitchen OVER THE COUNTER MEDICATION   Both Eyes   Place 2 drops into both eyes daily as needed (for itchy.dry eyes).         . traZODone (DESYREL) 150 MG tablet   Oral   Take 450 mg by mouth at bedtime.         . metroNIDAZOLE (FLAGYL) 500 MG tablet   Oral   Take 1 tablet (500 mg total) by mouth 2 (two) times daily.   14 tablet   0   . Prenatal Vit-Fe Fumarate-FA (PRENATAL COMPLETE) 14-0.4 MG TABS   Oral   Take 1 tablet by mouth daily.   60 each   0    BP 128/76  Pulse 97  Temp(Src) 97.6 F (36.4 C) (Oral)  Resp 20  Ht 5\' 8"  (1.727 m)  Wt 130 lb (58.968 kg)  BMI 19.77 kg/m2  SpO2 100%  LMP 12/11/2012 Physical Exam  Nursing  note and vitals reviewed. Constitutional: She is oriented to person, place, and time. She appears well-developed and well-nourished. No distress.  HENT:  Head: Normocephalic and atraumatic.  Mouth/Throat: Oropharynx is clear and moist. No oropharyngeal exudate.  Eyes: EOM are normal.  Neck: Neck supple. No tracheal deviation present.  Cardiovascular: Normal rate, regular rhythm, normal heart sounds and intact distal pulses.   No murmur heard. Pulmonary/Chest: Effort normal and breath sounds normal. No respiratory distress. She has no wheezes. She has no rales. She exhibits no tenderness.  Abdominal: Soft. Bowel sounds are normal. She exhibits no distension and no mass. There is no tenderness. There is no rebound and no guarding.  Genitourinary: Vaginal discharge found.  Whitish discharge, No CMT, no adnexal tenderness.  Musculoskeletal: Normal range of motion. She exhibits no edema and no tenderness.  Neurological: She is alert and oriented to person, place, and time. No cranial nerve deficit. She exhibits normal muscle tone. Coordination normal.   Skin: Skin is warm and dry.  Few areas of excoriation to the chest.   Psychiatric: She has a normal mood and affect. Her behavior is normal.    ED Course  Procedures   DIAGNOSTIC STUDIES: Oxygen Saturation is 100% on RA, normal by my interpretation.    COORDINATION OF CARE: 11:55 AM Will order abdominal US, CBC, CMP, UA, GC/Chlamydia, HCG pregnancy. Discussed treatment plan with pt at bedside and pt agreed to plan.  1:44 PM Discussed lab and imaging results with patient and family.    Labs Review Labs Reviewed  WET PREP, GENITAL - Abnormal; Notable for the following:    Clue Cells Wet Prep HPF POC MODERATE (*)    WBC, Wet Prep HPF POC MODERATE (*)    All other components within normal limits  URINALYSIS, ROUTINE W REFLEX MICROSCOPIC - Abnormal; Notable for the following:    Specific Gravity, Urine >1.030 (*)    All other components within normal limits  PREGNANCY, URINE - Abnormal; Notable for the following:    Preg Test, Ur POSITIVE (*)    All other components within normal limits  HCG, QUANTITATIVE, PREGNANCY - Abnormal; Notable for the following:    hCG, Beta Chain, Quant, S 604 (*)    All other components within normal limits  CBC WITH DIFFERENTIAL - Abnormal; Notable for the following:    RBC 3.74 (*)    HCT 34.8 (*)    All other components within normal limits  GC/CHLAMYDIA PROBE AMP  COMPREHENSIVE METABOLIC PANEL   Imaging Review US Ob Comp Less 14 Wks  01/15/2013   CLINICAL DATA:  Abdominal pain. Beta HCG is pending. By LMP, the patient is 5 weeks 0 days.  EXAM: OBSTETRIC <14 WK [place] AND TRANSVAGINAL OB US  TECHNIQUE: Both transabdominal and transvaginal ultrasound examinations were performed for complete evaluation of the gestation as well as the maternal uterus, adnexal regions, and pelvic cul-de-sac. Transvaginal technique was performed to assess early pregnancy.  COMPARISON:  None available  FINDINGS: Intrauterine gestational sac: Probable  Yolk sac:  Not seen   Embryo:  Not seen  Cardiac Activity: Not seen  MSD:  4.1  mm   5 w   1  d  Maternal uterus/adnexae: No subchorionic hemorrhage identified. Right corpus luteum cyst is noted. Left ovary has a normal appearance.  IMPRESSION: 1. Probable early intrauterine gestational sac, but no yolk sac, fetal pole, or cardiac activity yet visualized. Recommend follow-up quantitative B-HCG levels and follow-up US in 14 days to confirm and assess  viability. This recommendation follows SRU consensus guidelines: Diagnostic Criteria for Nonviable Pregnancy Early in the First Trimester. Malva Limes Med 2013; 578:4696-29. 2. No suspicious adnexal mass identified.   Electronically Signed   By: Rosalie Gums M.D.   On: 01/15/2013 13:14   US Ob Transvaginal  01/15/2013   CLINICAL DATA:  Abdominal pain. Beta HCG is pending. By LMP, the patient is 5 weeks 0 days.  EXAM: OBSTETRIC <14 WK [place] AND TRANSVAGINAL OB US  TECHNIQUE: Both transabdominal and transvaginal ultrasound examinations were performed for complete evaluation of the gestation as well as the maternal uterus, adnexal regions, and pelvic cul-de-sac. Transvaginal technique was performed to assess early pregnancy.  COMPARISON:  None available  FINDINGS: Intrauterine gestational sac: Probable  Yolk sac:  Not seen  Embryo:  Not seen  Cardiac Activity: Not seen  MSD:  4.1  mm   5 w   1  d  Maternal uterus/adnexae: No subchorionic hemorrhage identified. Right corpus luteum cyst is noted. Left ovary has a normal appearance.  IMPRESSION: 1. Probable early intrauterine gestational sac, but no yolk sac, fetal pole, or cardiac activity yet visualized. Recommend follow-up quantitative B-HCG levels and follow-up US in 14 days to confirm and assess viability. This recommendation follows SRU consensus guidelines: Diagnostic Criteria for Nonviable Pregnancy Early in the First Trimester. Malva Limes Med 2013; 528:4132-44. 2. No suspicious adnexal mass identified.   Electronically Signed   By: Rosalie Gums  M.D.   On: 01/15/2013 13:14    EKG Interpretation   None       MDM   1. Pregnancy   2. Bacterial vaginosis   3. Cough    One week of nonproductive cough and nausea. Patient is a smoker. She is a history of Crohn's disease but is not on any medications for it.  Patient's lungs are clear. She is not hypoxic.  Given + HCG, CXR was not obtained.  She is encouraged to quit smoking.  Pregnancy test. Pelvic exam benign. Will treat bacterial vaginosis as she does have discharge. Ultrasound shows gestational sac is intrauterine no fetal pole. Quant is 600. Will need repeat ultrasound in 14 days. Patient referred to gynecology.  I personally performed the services described in this documentation, which was scribed in my presence. The recorded information has been reviewed and is accurate.   Glynn Octave, MD 01/15/13 [phone]

## 2013-01-15 NOTE — ED Notes (Signed)
Pt c/o having patches of itchy rash in various places on her body for the past 2 weeks.  C/O of nonproductive cough and nausea for the past week.   Denies fever, denies vomiting.

## 2013-01-16 LAB — GC/CHLAMYDIA PROBE AMP: CT Probe RNA: NEGATIVE

## 2013-03-11 ENCOUNTER — Other Ambulatory Visit: Payer: Self-pay

## 2013-05-13 ENCOUNTER — Other Ambulatory Visit (HOSPITAL_COMMUNITY): Payer: Self-pay | Admitting: Unknown Physician Specialty

## 2013-05-13 DIAGNOSIS — O283 Abnormal ultrasonic finding on antenatal screening of mother: Secondary | ICD-10-CM

## 2013-05-13 DIAGNOSIS — Z3689 Encounter for other specified antenatal screening: Secondary | ICD-10-CM

## 2013-05-18 ENCOUNTER — Encounter (HOSPITAL_COMMUNITY): Payer: Self-pay

## 2013-05-18 ENCOUNTER — Ambulatory Visit (HOSPITAL_COMMUNITY)
Admission: RE | Admit: 2013-05-18 | Discharge: 2013-05-18 | Disposition: A | Discharge status: Home / Self Care | Payer: Medicaid Other | Source: Ambulatory Visit | Attending: Unknown Physician Specialty | Admitting: Unknown Physician Specialty

## 2013-05-18 ENCOUNTER — Other Ambulatory Visit (HOSPITAL_COMMUNITY): Payer: Self-pay | Admitting: Unknown Physician Specialty

## 2013-05-18 ENCOUNTER — Other Ambulatory Visit: Payer: Self-pay

## 2013-05-18 DIAGNOSIS — O3500X0 Maternal care for (suspected) central nervous system malformation or damage in fetus, unspecified, not applicable or unspecified: Secondary | ICD-10-CM | POA: Insufficient documentation

## 2013-05-18 DIAGNOSIS — O99330 Smoking (tobacco) complicating pregnancy, unspecified trimester: Secondary | ICD-10-CM | POA: Insufficient documentation

## 2013-05-18 DIAGNOSIS — O283 Abnormal ultrasonic finding on antenatal screening of mother: Secondary | ICD-10-CM

## 2013-05-18 DIAGNOSIS — Z363 Encounter for antenatal screening for malformations: Secondary | ICD-10-CM | POA: Insufficient documentation

## 2013-05-18 DIAGNOSIS — Z3689 Encounter for other specified antenatal screening: Secondary | ICD-10-CM

## 2013-05-18 DIAGNOSIS — O358XX0 Maternal care for other (suspected) fetal abnormality and damage, not applicable or unspecified: Secondary | ICD-10-CM | POA: Insufficient documentation

## 2013-05-18 DIAGNOSIS — Z1389 Encounter for screening for other disorder: Secondary | ICD-10-CM | POA: Insufficient documentation

## 2013-05-18 DIAGNOSIS — O2620 Pregnancy care for patient with recurrent pregnancy loss, unspecified trimester: Secondary | ICD-10-CM | POA: Insufficient documentation

## 2013-05-18 DIAGNOSIS — O350XX0 Maternal care for (suspected) central nervous system malformation in fetus, not applicable or unspecified: Secondary | ICD-10-CM | POA: Insufficient documentation

## 2013-05-18 LAB — ROUTINE CHROMOSOME - KARYOTYPE

## 2013-05-18 NOTE — Progress Notes (Addendum)
Genetic Counseling  High-Risk Gestation Note  Appointment Date:  05/18/2013 Referred By: Santo Held, MD Date of Birth:  August 14, 1985 Partner:  Sharlene Mack    Pregnancy History: [id number] Estimated Date of Delivery: 09/17/13 Estimated Gestational Age: 17w4dAttending: MRenella Cunas MD  I met with Ms. Katherine INFANTINOand her partner, Mr. Katherine Mack for genetic counseling because of the previous ultrasound finding of ventriculomegaly.  We began by reviewing the ultrasound in detail. We discussed that ultrasound performed today confirmed the pregnancy to be 216w4destation. Bilateral ventriculomegaly was also visualized at this time. The right ventricle measured 11 mm, and the left ventricle measured 13 mm. The remaining visualized fetal anatomy appeared normal. The couple understands that ultrasound does not detect or rule out all birth defects prenatally. We counseled the couple that ventriculomegaly refers to an increased measurement of the lateral ventricles in the brain greater than 10 mm. Possible causes for mild ventriculomegaly include overproduction of cerebrospinal fluid, a blockage in a duct causing the fluid to accumulate, an intrauterine infection, or a variation of normal. We discussed that once ventriculomegaly is identified in pregnancy, follow-up ultrasounds are offered to assess for progression of ventriculomegaly. Following delivery, post-natal studies may be required in order to track progress and assess for any other differences in brain anatomy. It was discussed that surgical intervention may be required in some cases of ventriculomegaly. Most cases of isolated mild ventriculomegaly do not have an identified cause or associated condition, tend to regress with time, and have no resulting health or learning problems. However, it is possible that this finding may be seen in association with other ultrasound differences or a genetic condition. There is also a slightly  increased chance of developmental delays. We discussed that the presence of ventriculomegaly is associated with an increased risk for aneuploidy, specifically Down syndrome.   We reviewed chromosomes, nondisjunction, and the features and prognosis of Down syndrome. They were counseled regarding maternal age and the association with risk for chromosome conditions due to nondisjunction with aging of ova. Katherine Mack had first trimester screening, which reduced the risk for fetal Down syndrome to less than 1 in 10,000. The ultrasound finding of mild ventriculomegaly would increase the risk for Down syndrome from the patient's first trimester screen risk to approximately less than 1 in 1,000. This couple was counseled regarding their options of amniocentesis and cell free DNA testing. It was discussed that not all birth defects can be identified with these procedures. A risk of 1 in 300-500 was given for amniocentesis, the primary complication being spontaneous pregnancy loss or preterm delivery. They indicated that they understood these risks and decided to proceed with amniocentesis at the time of today's visit for karyotype, AF-AFP, and viral studies. A follow-up ultrasound was planned in 4 weeks.   Both family histories were reviewed and found to be contributory for learning delays for the patient. She reported that she was a slow learner and was in special classes in elementary school. A specific cause is not known for her learning delays. Learning disabilities are quite common and most of the time we do not know why they occur.  Sometimes, learning disabilities may result from an underlying genetic condition.  In the absence of an underlying condition, when one parent has learning disabilities this increases their child's chance to have learning disabilities as well. Without further information regarding the provided family history, an accurate genetic risk cannot be calculated.   Katherine Mack  a history of  four miscarriages with a previous partner, reportedly attributed to endometriosis. Additionally, the patient reported that her sister had germ cell carcinoma, which was treated by hysterectomy at age 87 months. Though most cancers are thought to be sporadic or due to environmental factors, some families appear to have a strong predisposition to cancers. The reported family history is not suggestive of a particular hereditary cancer syndrome. Additional information may alter recurrence risk assessment. Further genetic counseling is warranted if more information is obtained.  Katherine Mack was provided with written information regarding cystic fibrosis (CF) including the carrier frequency and incidence in the Caucasian population, the availability of carrier testing and prenatal diagnosis if indicated.  In addition, we discussed that CF is routinely screened for as part of the [place] newborn screening panel.  She declined testing today.   Katherine Mack denied exposure to environmental toxins or chemical agents. She denied the use of street drugs. She reported drinking alcohol prior to being aware of the pregnancy. She reported no alcohol use since [redacted] weeks gestation. The all-or-none period was discussed, meaning exposures that occur in the first 4 weeks of gestation are typically thought to either not affect the pregnancy at all or result in a miscarriage.  Prenatal alcohol exposure can increase the risk for growth delays, small head size, heart defects, eye and facial differences, as well as behavior problems and learning disabilities. The risk of these to occur tends to increase with the amount of alcohol consumed. However, because there is no identified safe amount of alcohol in pregnancy, it is recommended to completely avoid alcohol in pregnancy. Given the reported amount of exposure, risk for associated effects are likely low in the current pregnancy. She reported smoking less than 1  cigarette per day on average. The associations of smoking in pregnancy were reviewed and cessation encouraged.   She denied significant viral illnesses during the course of her pregnancy. Her medical and surgical histories were contributory for use of trazodone and fluoxetine during the pregnancy. Prozac use during pregnancy does not appear to increase the risk for birth defects.  Using Prozac late in pregnancy can increase the chance for a mild transient neonatal syndrome of central nervous system including irritability, vomiting, jitteriness and convulsions, as well as neonatal pulmonary hypertension.  These associated symptoms typically do not appear to occur frequently or severely. We discussed that available animal studies and limited human experience do not indicate an expected increased risk for congenital anomalies associated with prenatal use of trazodone.    I counseled this couple regarding the above risks and available options.  The approximate face-to-face time with the genetic counselor was 45 minutes.  Chipper Oman, MS Certified Genetic Counselor 05/18/2013

## 2013-05-20 ENCOUNTER — Other Ambulatory Visit (HOSPITAL_COMMUNITY): Payer: Self-pay | Admitting: Maternal and Fetal Medicine

## 2013-05-20 DIAGNOSIS — O3500X0 Maternal care for (suspected) central nervous system malformation or damage in fetus, unspecified, not applicable or unspecified: Secondary | ICD-10-CM

## 2013-05-20 DIAGNOSIS — Q048 Other specified congenital malformations of brain: Secondary | ICD-10-CM

## 2013-05-20 DIAGNOSIS — O350XX0 Maternal care for (suspected) central nervous system malformation in fetus, not applicable or unspecified: Secondary | ICD-10-CM

## 2013-05-20 DIAGNOSIS — O2620 Pregnancy care for patient with recurrent pregnancy loss, unspecified trimester: Secondary | ICD-10-CM

## 2013-05-20 DIAGNOSIS — O358XX0 Maternal care for other (suspected) fetal abnormality and damage, not applicable or unspecified: Secondary | ICD-10-CM

## 2013-05-20 DIAGNOSIS — Z1389 Encounter for screening for other disorder: Secondary | ICD-10-CM

## 2013-05-21 ENCOUNTER — Other Ambulatory Visit (HOSPITAL_COMMUNITY): Payer: Medicaid Other

## 2013-05-27 ENCOUNTER — Telehealth (HOSPITAL_COMMUNITY): Payer: Self-pay | Admitting: MS"

## 2013-05-27 NOTE — Telephone Encounter (Signed)
Left message for patient to return call.   [name] 05/27/2013 11:26 AM   Called Katherine Mack to discuss the final results from her amniocentesis. We reviewed that the karyotype analysis is within normal limits (46,XX). She understands that this does not test for all genetic conditions. Additionally, CMV and toxoplasmosis PCR studies were negative.  All questions were answered to her satisfaction, she was encouraged to call with additional questions or concerns.  [name], MS Certified Genetic Counselor 05/27/2013 2:36 PM

## 2013-05-28 ENCOUNTER — Other Ambulatory Visit: Payer: Self-pay

## 2013-06-03 ENCOUNTER — Other Ambulatory Visit: Payer: Self-pay

## 2013-06-14 ENCOUNTER — Other Ambulatory Visit (HOSPITAL_COMMUNITY): Payer: Self-pay | Admitting: Maternal and Fetal Medicine

## 2013-06-14 ENCOUNTER — Ambulatory Visit (HOSPITAL_COMMUNITY)
Admission: RE | Admit: 2013-06-14 | Discharge: 2013-06-14 | Disposition: A | Discharge status: Home / Self Care | Payer: Medicaid Other | Source: Ambulatory Visit | Attending: Maternal and Fetal Medicine | Admitting: Maternal and Fetal Medicine

## 2013-06-14 ENCOUNTER — Ambulatory Visit (HOSPITAL_COMMUNITY)
Admission: RE | Admit: 2013-06-14 | Discharge: 2013-06-14 | Disposition: A | Discharge status: Home / Self Care | Payer: Medicaid Other | Source: Ambulatory Visit | Attending: Unknown Physician Specialty | Admitting: Unknown Physician Specialty

## 2013-06-14 DIAGNOSIS — O350XX0 Maternal care for (suspected) central nervous system malformation in fetus, not applicable or unspecified: Secondary | ICD-10-CM

## 2013-06-14 DIAGNOSIS — O358XX0 Maternal care for other (suspected) fetal abnormality and damage, not applicable or unspecified: Secondary | ICD-10-CM | POA: Insufficient documentation

## 2013-06-14 DIAGNOSIS — O2620 Pregnancy care for patient with recurrent pregnancy loss, unspecified trimester: Secondary | ICD-10-CM | POA: Insufficient documentation

## 2013-06-14 DIAGNOSIS — O3500X0 Maternal care for (suspected) central nervous system malformation or damage in fetus, unspecified, not applicable or unspecified: Secondary | ICD-10-CM

## 2013-06-14 DIAGNOSIS — Q048 Other specified congenital malformations of brain: Secondary | ICD-10-CM

## 2013-06-14 DIAGNOSIS — Z1389 Encounter for screening for other disorder: Secondary | ICD-10-CM | POA: Insufficient documentation

## 2013-06-14 DIAGNOSIS — Z363 Encounter for antenatal screening for malformations: Secondary | ICD-10-CM | POA: Insufficient documentation

## 2013-06-14 MED ORDER — BETAMETHASONE SOD PHOS & ACET 6 (3-3) MG/ML IJ SUSP
12.0000 mg | Freq: Once | INTRAMUSCULAR | Status: AC
  Administered 2013-06-14: 12 mg via INTRAMUSCULAR
  Filled 2013-06-14: qty 2

## 2013-06-14 NOTE — Progress Notes (Signed)
[facility] ultrasound  Indication: 28 yr old [id number] at [redacted]w[redacted]d with fetal ventriculomegaly and suspected absent CSP with lagging growth for follow up ultrasound.  Findings: 1. Single intrauterine pregnancy. 2. Estimated fetal weight is in the 13th%. All biometry parameters are in the <5th%. 3. Posterior placenta without evidence of previa. 4. Normal amniotic fluid volume. 5. Normal transvaginal cervical length. 6. The cavum is not seen. 7. Again seen is bilateral ventriculomegaly measuring 1-1.4cm. The choroid is dangling. 8. The remainder of the limited anatomy survey is normal. 9. Elevated systolic/diastolic ratio on umbilical artery Doppler studies.  Recommendations: 1. Overall appropriate fetal growth; although parameters lagging: - discussed the increased risk of need for preterm delivery and concern for placental insufficiency - discussed elevated Doppler studies - recommend a course of betamethasone- discussed benefits in reducing morbidity and mortality in premature infants - recommend fetal growth in 2-3 weeks - recommend repeat Doppler studies on 06/17/13 and if stable may repeat weekly - recommend start BPPs at 28 weeks or sooner if clinically indicated 2. Ventriculomegaly: - previously counseled - had normal fetal karyotype on amniocentesis - amniotic fluid negative for toxoplasmosis and CMV - if persists recommend referral to Pediatric Neurosurgery - stable in size 3. Suspected absent cavum: - previously counseled   [place], MD

## 2013-06-17 ENCOUNTER — Ambulatory Visit (HOSPITAL_COMMUNITY)
Admission: RE | Admit: 2013-06-17 | Discharge: 2013-06-17 | Disposition: A | Discharge status: Home / Self Care | Payer: Medicaid Other | Source: Ambulatory Visit | Attending: Maternal and Fetal Medicine | Admitting: Maternal and Fetal Medicine

## 2013-06-17 ENCOUNTER — Other Ambulatory Visit (HOSPITAL_COMMUNITY): Payer: Self-pay | Admitting: Maternal and Fetal Medicine

## 2013-06-17 DIAGNOSIS — Z1389 Encounter for screening for other disorder: Secondary | ICD-10-CM

## 2013-06-17 DIAGNOSIS — O2620 Pregnancy care for patient with recurrent pregnancy loss, unspecified trimester: Secondary | ICD-10-CM | POA: Insufficient documentation

## 2013-06-17 DIAGNOSIS — O358XX0 Maternal care for other (suspected) fetal abnormality and damage, not applicable or unspecified: Secondary | ICD-10-CM

## 2013-06-17 DIAGNOSIS — Q048 Other specified congenital malformations of brain: Secondary | ICD-10-CM

## 2013-06-17 DIAGNOSIS — O350XX0 Maternal care for (suspected) central nervous system malformation in fetus, not applicable or unspecified: Secondary | ICD-10-CM

## 2013-06-17 DIAGNOSIS — Z363 Encounter for antenatal screening for malformations: Secondary | ICD-10-CM | POA: Insufficient documentation

## 2013-06-17 DIAGNOSIS — O3500X0 Maternal care for (suspected) central nervous system malformation or damage in fetus, unspecified, not applicable or unspecified: Secondary | ICD-10-CM | POA: Insufficient documentation

## 2013-06-17 NOTE — ED Notes (Signed)
Pt states she has been leaking for a week.  States she informed her doctor of this and they reassured her that it was "discharge".  Denies bleeding.

## 2013-06-21 ENCOUNTER — Ambulatory Visit (HOSPITAL_COMMUNITY)
Admission: RE | Admit: 2013-06-21 | Discharge: 2013-06-21 | Disposition: A | Discharge status: Home / Self Care | Payer: Medicaid Other | Source: Ambulatory Visit | Attending: Maternal and Fetal Medicine | Admitting: Maternal and Fetal Medicine

## 2013-06-21 DIAGNOSIS — Q048 Other specified congenital malformations of brain: Secondary | ICD-10-CM

## 2013-06-21 DIAGNOSIS — Z1389 Encounter for screening for other disorder: Secondary | ICD-10-CM

## 2013-06-21 DIAGNOSIS — O358XX0 Maternal care for other (suspected) fetal abnormality and damage, not applicable or unspecified: Secondary | ICD-10-CM | POA: Insufficient documentation

## 2013-06-21 DIAGNOSIS — Z363 Encounter for antenatal screening for malformations: Secondary | ICD-10-CM | POA: Insufficient documentation

## 2013-06-21 DIAGNOSIS — O350XX0 Maternal care for (suspected) central nervous system malformation in fetus, not applicable or unspecified: Secondary | ICD-10-CM

## 2013-06-21 DIAGNOSIS — O3500X0 Maternal care for (suspected) central nervous system malformation or damage in fetus, unspecified, not applicable or unspecified: Secondary | ICD-10-CM

## 2013-06-21 DIAGNOSIS — O2620 Pregnancy care for patient with recurrent pregnancy loss, unspecified trimester: Secondary | ICD-10-CM

## 2013-06-28 ENCOUNTER — Encounter (HOSPITAL_COMMUNITY): Payer: Self-pay

## 2013-06-28 ENCOUNTER — Ambulatory Visit (HOSPITAL_COMMUNITY)
Admission: RE | Admit: 2013-06-28 | Discharge: 2013-06-28 | Disposition: A | Discharge status: Home / Self Care | Payer: Medicaid Other | Source: Ambulatory Visit | Attending: Maternal and Fetal Medicine | Admitting: Maternal and Fetal Medicine

## 2013-06-28 DIAGNOSIS — Z1389 Encounter for screening for other disorder: Secondary | ICD-10-CM

## 2013-06-28 DIAGNOSIS — O3500X0 Maternal care for (suspected) central nervous system malformation or damage in fetus, unspecified, not applicable or unspecified: Secondary | ICD-10-CM | POA: Insufficient documentation

## 2013-06-28 DIAGNOSIS — Z363 Encounter for antenatal screening for malformations: Secondary | ICD-10-CM | POA: Insufficient documentation

## 2013-06-28 DIAGNOSIS — Q048 Other specified congenital malformations of brain: Secondary | ICD-10-CM

## 2013-06-28 DIAGNOSIS — O2620 Pregnancy care for patient with recurrent pregnancy loss, unspecified trimester: Secondary | ICD-10-CM

## 2013-06-28 DIAGNOSIS — O358XX0 Maternal care for other (suspected) fetal abnormality and damage, not applicable or unspecified: Secondary | ICD-10-CM | POA: Insufficient documentation

## 2013-06-28 DIAGNOSIS — O350XX0 Maternal care for (suspected) central nervous system malformation in fetus, not applicable or unspecified: Secondary | ICD-10-CM | POA: Insufficient documentation

## 2013-06-28 NOTE — ED Notes (Signed)
Pt states she has been leaking for about a week.  Has to wear a panty liner.  Denies bleeding.  +FM.

## 2013-06-30 ENCOUNTER — Encounter: Payer: Self-pay | Admitting: Unknown Physician Specialty

## 2013-07-07 ENCOUNTER — Encounter (HOSPITAL_COMMUNITY): Payer: Self-pay

## 2013-07-07 ENCOUNTER — Inpatient Hospital Stay (HOSPITAL_COMMUNITY)
Admission: AD | Admit: 2013-07-07 | Discharge: 2013-07-07 | Disposition: A | Discharge status: Home / Self Care | Payer: Medicaid Other | Source: Ambulatory Visit | Attending: Family Medicine | Admitting: Family Medicine

## 2013-07-07 ENCOUNTER — Ambulatory Visit (HOSPITAL_COMMUNITY)
Admission: RE | Admit: 2013-07-07 | Discharge: 2013-07-07 | Disposition: A | Discharge status: Home / Self Care | Payer: Medicaid Other | Source: Ambulatory Visit | Attending: Unknown Physician Specialty | Admitting: Unknown Physician Specialty

## 2013-07-07 ENCOUNTER — Encounter (HOSPITAL_COMMUNITY): Payer: Self-pay | Admitting: *Deleted

## 2013-07-07 ENCOUNTER — Ambulatory Visit (HOSPITAL_COMMUNITY)
Admission: RE | Admit: 2013-07-07 | Discharge: 2013-07-07 | Disposition: A | Discharge status: Home / Self Care | Payer: Medicaid Other | Source: Ambulatory Visit | Attending: Maternal and Fetal Medicine | Admitting: Maternal and Fetal Medicine

## 2013-07-07 DIAGNOSIS — O350XX0 Maternal care for (suspected) central nervous system malformation in fetus, not applicable or unspecified: Secondary | ICD-10-CM

## 2013-07-07 DIAGNOSIS — L738 Other specified follicular disorders: Secondary | ICD-10-CM | POA: Insufficient documentation

## 2013-07-07 DIAGNOSIS — O368390 Maternal care for abnormalities of the fetal heart rate or rhythm, unspecified trimester, not applicable or unspecified: Secondary | ICD-10-CM | POA: Insufficient documentation

## 2013-07-07 DIAGNOSIS — L02219 Cutaneous abscess of trunk, unspecified: Secondary | ICD-10-CM | POA: Insufficient documentation

## 2013-07-07 DIAGNOSIS — O3500X0 Maternal care for (suspected) central nervous system malformation or damage in fetus, unspecified, not applicable or unspecified: Secondary | ICD-10-CM | POA: Insufficient documentation

## 2013-07-07 DIAGNOSIS — O99330 Smoking (tobacco) complicating pregnancy, unspecified trimester: Secondary | ICD-10-CM | POA: Insufficient documentation

## 2013-07-07 DIAGNOSIS — K5090 Crohn's disease, unspecified, without complications: Secondary | ICD-10-CM | POA: Insufficient documentation

## 2013-07-07 DIAGNOSIS — L03319 Cellulitis of trunk, unspecified: Secondary | ICD-10-CM

## 2013-07-07 DIAGNOSIS — L678 Other hair color and hair shaft abnormalities: Secondary | ICD-10-CM | POA: Insufficient documentation

## 2013-07-07 DIAGNOSIS — O9989 Other specified diseases and conditions complicating pregnancy, childbirth and the puerperium: Principal | ICD-10-CM

## 2013-07-07 DIAGNOSIS — L0390 Cellulitis, unspecified: Secondary | ICD-10-CM

## 2013-07-07 DIAGNOSIS — O99891 Other specified diseases and conditions complicating pregnancy: Secondary | ICD-10-CM | POA: Insufficient documentation

## 2013-07-07 DIAGNOSIS — O2620 Pregnancy care for patient with recurrent pregnancy loss, unspecified trimester: Secondary | ICD-10-CM | POA: Insufficient documentation

## 2013-07-07 DIAGNOSIS — Z363 Encounter for antenatal screening for malformations: Secondary | ICD-10-CM | POA: Insufficient documentation

## 2013-07-07 DIAGNOSIS — O358XX0 Maternal care for other (suspected) fetal abnormality and damage, not applicable or unspecified: Secondary | ICD-10-CM

## 2013-07-07 DIAGNOSIS — L0291 Cutaneous abscess, unspecified: Secondary | ICD-10-CM

## 2013-07-07 DIAGNOSIS — Q048 Other specified congenital malformations of brain: Secondary | ICD-10-CM

## 2013-07-07 DIAGNOSIS — Z1389 Encounter for screening for other disorder: Secondary | ICD-10-CM

## 2013-07-07 MED ORDER — CLINDAMYCIN HCL 150 MG PO CAPS: 300.0000 mg | ORAL_CAPSULE | Freq: Three times a day (TID) | ORAL | Status: DC

## 2013-07-07 MED ORDER — LIDOCAINE HCL (PF) 1 % IJ SOLN
INTRAMUSCULAR | Status: AC
  Administered 2013-07-07: 30 mL
  Filled 2013-07-07: qty 30

## 2013-07-07 NOTE — MAU Note (Signed)
I and D performed by DrsOlevia Bowens and Raeford Razor.

## 2013-07-07 NOTE — MAU Provider Note (Signed)
Chief Complaint:  Non-stress Test   Katherine Mack is a 28 y.o.  [phone] with IUP at [redacted]w[redacted]d presenting for Non-stress Test She was seen at [facility] today and had a BPP 6/10 (2 off for breathing, 2 for nonreactive NST). She was referred to the MAU for extended monitoring.  She also complains of a area of redness and swelling above her suprapubic bone. Started 5 days ago after shaving. She was seen in the ED on Sunday and given clindamycin 300 mg TID.  The area has not improved since the start of the antibiotics. There is no discharge.   No fevers, chills, nausea, vomiting, diarrhea.   Menstrual History: OB History   Grav Para Term Preterm Abortions TAB SAB Ect Mult Living   7 2 1 1 4  0 4 0 0 2      Patient's last menstrual period was 12/11/2012.      Past Medical History  Diagnosis Date  . Sciatica   . Depression   . Anxiety   . Crohn's disease     Past Surgical History  Procedure Laterality Date  . Bunionectomy    . Cholecystectomy    . Mouth surgery      History reviewed. No pertinent family history.  History  Substance Use Topics  . Smoking status: Current Every Day Smoker -- 0.25 packs/day  . Smokeless tobacco: Never Used  . Alcohol Use: No      Allergies  Allergen Reactions  . Onion Anaphylaxis and Other (See Comments)  . Paxil [Paroxetine Hcl]   . Prednisone Other (See Comments)    Hives and blisters    Prescriptions prior to admission  Medication Sig Dispense Refill  . acetaminophen (TYLENOL) 500 MG tablet Take 1,000 mg by mouth 4 (four) times daily as needed for headache.      . albuterol (PROVENTIL HFA;VENTOLIN HFA) 108 (90 BASE) MCG/ACT inhaler Inhale 2 puffs into the lungs every 6 (six) hours as needed for wheezing or shortness of breath.      . clindamycin (CLEOCIN) 150 MG capsule Take 150 mg by mouth 2 (two) times daily as needed.      . doxylamine, Sleep, (UNISOM) 25 MG tablet Take 25 mg by mouth at bedtime as needed.      Marland Kitchen FLUoxetine (PROZAC) 20 MG  capsule Take 20 mg by mouth daily.      Marland Kitchen neomycin-bacitracin-polymyxin (NEOSPORIN) [phone] ointment Apply 1 application topically 4 (four) times daily.      . Prenatal Vit-Fe Fumarate-FA (PRENATAL COMPLETE) 14-0.4 MG TABS Take 1 tablet by mouth daily.  60 each  0  . traZODone (DESYREL) 150 MG tablet Take 450 mg by mouth at bedtime.        Review of Systems - Negative except for what is mentioned in HPI.  Physical Exam  Blood pressure 114/67, pulse 82, temperature 97.9 F (36.6 C), temperature source Oral, resp. rate 18, height 5\' 8"  (1.727 m), last menstrual period 12/11/2012. GENERAL: Well-developed, well-nourished female in no acute distress.  LUNGS: Clear to auscultation bilaterally.  HEART: Regular rate and rhythm. ABDOMEN: Soft, nontender, nondistended, gravid.  EXTREMITIES: Nontender, no edema, 2+ distal pulses. Skin: 4 cm x 6 cm area of induration located proximal to the suprapubic bone. Small fluctuant area in the center approx 1cm. No streaking.  FHT:  Baseline rate 130 bpm   Variability moderate  Accelerations present   Decelerations none Contractions: none   Labs: No results found for this or any previous visit (from  the past 24 hour(s)).  Imaging Studies:  US Ob Follow Up  06/14/2013   OBSTETRICAL ULTRASOUND: This exam was performed within a [facility]. The OB US report was generated in the AS system, and faxed to the ordering physician.   This report is also available in [employer] and in the [facility]. See AS Obstetric US report.  US Fetal Bpp W/o Non Stress  07/07/2013   OBSTETRICAL ULTRASOUND: This exam was performed within a [facility]. The OB US report was generated in the AS system, and faxed to the ordering physician.   This report is also available in [employer] and in the [facility]. See AS Obstetric US report.  US Fetal Bpp W/o Non Stress  06/28/2013   OBSTETRICAL  ULTRASOUND: This exam was performed within a [facility] Department. The OB US report was generated in the AS system, and faxed to the ordering physician.   This report is also available in [employer] and in the [facility]. See AS Obstetric US report.  US Fetal Bpp W/o Non Stress  06/21/2013   OBSTETRICAL ULTRASOUND: This exam was performed within a [facility] Department. The OB US report was generated in the AS system, and faxed to the ordering physician.   This report is also available in [employer] and in the [facility]. See AS Obstetric US report.  [place] Ua Cord Doppler  07/07/2013   OBSTETRICAL ULTRASOUND: This exam was performed within a [facility] Department. The OB US report was generated in the AS system, and faxed to the ordering physician.   This report is also available in [employer] and in the [facility]. See AS Obstetric US report.  [place] Ua Cord Doppler  06/28/2013   OBSTETRICAL ULTRASOUND: This exam was performed within a [facility] Department. The OB US report was generated in the AS system, and faxed to the ordering physician.   This report is also available in [employer] and in the [facility]. See AS Obstetric US report.  [place] Ua Cord Doppler  06/21/2013   OBSTETRICAL ULTRASOUND: This exam was performed within a [facility] Department. The OB US report was generated in the AS system, and faxed to the ordering physician.   This report is also available in [employer] and in the [facility]. See AS Obstetric US report.  [place] Ua Cord Doppler  06/17/2013   OBSTETRICAL ULTRASOUND: This exam was performed within a [facility] Department. The OB US report was generated in the AS system, and faxed to the ordering physician.   This report is also available in [employer] and in the [facility].  See AS Obstetric US report.  [place] Ua Cord Doppler  06/14/2013   OBSTETRICAL ULTRASOUND: This exam was performed within a [facility] Department. The OB US report was generated in the AS system, and faxed to the ordering physician.   This report is also available in [employer] and in the [facility]. See AS Obstetric US report.  [place] Mfm Ob Transvaginal  06/14/2013   OBSTETRICAL ULTRASOUND: This exam was performed within a [facility] Department. The OB US report was generated in the AS system, and faxed to the ordering physician.   This report is also available in [employer] and in the [facility]. See AS Obstetric US report.   Assessment: Katherine Mcglory  Mack is  28 y.o. [id number] at [redacted]w[redacted]d presents with Non-stress Test 1. NST  2. Folliculitis/abscess  I&D:  Appropriate time out was taken. Area prepped and draped in usual sterile fashion. 3 cc of 1% lidocaine without epinephrine was injected. 5 mm incision with expression of purulent material. Adhesions were broken with a hemostat. Incision was left open and bandaged for further drainage.The patient tolerated the procedure well. There were no complications. Post procedure instructions were given.  Plan: Discharge to home. Discussed with Dr. Olevia Bowens.   #Folliculitis/Abscess: I&D performed with expression of purulent material. Pain much improved.  - continue course of Clindamycin until finished  #NST: reactive and reassuring.  - f/u next week with OB   Rosemarie Ax 5/27/201511:58 AM  I have seen and examined this patient and agree with above documentation in the resident's note. 28 yo [id number] @[redacted]w[redacted]d  who presents for BPP of 6/10 as above and prolonged monitoring as well as for a mons abscess.   Abscess I&D as above. I supervised the resident during the procedure.   NST beautifully reactive and reassuring making overall BPP 8/10  Will d/c to home with f/u as scheduled at her  doctor's office in [place].    Ebbie Latus, M.D. [facility] Fellow 07/07/2013 5:56 PM

## 2013-07-07 NOTE — Discharge Instructions (Signed)
Abscess  Care After  An abscess (also called a boil or furuncle) is an infected area that contains a collection of pus. Signs and symptoms of an abscess include pain, tenderness, redness, or hardness, or you may feel a moveable soft area under your skin. An abscess can occur anywhere in the body. The infection may spread to surrounding tissues causing cellulitis. A cut (incision) by the surgeon was made over your abscess and the pus was drained out. Gauze may have been packed into the space to provide a drain that will allow the cavity to heal from the inside outwards. The boil may be painful for 5 to 7 days. Most people with a boil do not have high fevers. Your abscess, if seen early, may not have localized, and may not have been lanced. If not, another appointment may be required for this if it does not get better on its own or with medications.  HOME CARE INSTRUCTIONS   · Only take over-the-counter or prescription medicines for pain, discomfort, or fever as directed by your caregiver.  · When you bathe, soak and then remove gauze or iodoform packs at least daily or as directed by your caregiver. You may then wash the wound gently with mild soapy water. Repack with gauze or do as your caregiver directs.  SEEK IMMEDIATE MEDICAL CARE IF:   · You develop increased pain, swelling, redness, drainage, or bleeding in the wound site.  · You develop signs of generalized infection including muscle aches, chills, fever, or a general ill feeling.  · An oral temperature above 102° F (38.9° C) develops, not controlled by medication.  See your caregiver for a recheck if you develop any of the symptoms described above. If medications (antibiotics) were prescribed, take them as directed.  Document Released: 08/16/2004 Document Revised: 04/22/2011 Document Reviewed: 04/13/2007  Exit[employer]® Patient Information ©2014 [employer].

## 2013-07-07 NOTE — MAU Note (Signed)
Patient sent from [facility] for NST. Also would like to have a "bump" on her perineum assessed.

## 2013-07-08 NOTE — MAU Provider Note (Signed)
Attestation of Attending Supervision of Advanced Practitioner (PA/CNM/NP): Evaluation and management procedures were performed by the Advanced Practitioner under my supervision and collaboration.  I have reviewed the Advanced Practitioner's note and chart, and I agree with the management and plan.  Donnamae Jude, MD [facility] Attending 07/08/2013 8:22 AM

## 2013-07-12 ENCOUNTER — Ambulatory Visit (HOSPITAL_COMMUNITY): Admission: RE | Admit: 2013-07-12 | Payer: Medicaid Other | Source: Ambulatory Visit

## 2013-07-19 ENCOUNTER — Ambulatory Visit (HOSPITAL_COMMUNITY)
Admission: RE | Admit: 2013-07-19 | Discharge: 2013-07-19 | Disposition: A | Discharge status: Home / Self Care | Payer: Medicaid Other | Source: Ambulatory Visit | Attending: Unknown Physician Specialty | Admitting: Unknown Physician Specialty

## 2013-07-19 ENCOUNTER — Encounter (HOSPITAL_COMMUNITY): Payer: Self-pay

## 2013-07-19 DIAGNOSIS — Z363 Encounter for antenatal screening for malformations: Secondary | ICD-10-CM | POA: Insufficient documentation

## 2013-07-19 DIAGNOSIS — Q048 Other specified congenital malformations of brain: Secondary | ICD-10-CM

## 2013-07-19 DIAGNOSIS — Z1389 Encounter for screening for other disorder: Secondary | ICD-10-CM

## 2013-07-19 DIAGNOSIS — O358XX0 Maternal care for other (suspected) fetal abnormality and damage, not applicable or unspecified: Secondary | ICD-10-CM

## 2013-07-19 DIAGNOSIS — O2620 Pregnancy care for patient with recurrent pregnancy loss, unspecified trimester: Secondary | ICD-10-CM | POA: Insufficient documentation

## 2013-07-19 DIAGNOSIS — O3500X0 Maternal care for (suspected) central nervous system malformation or damage in fetus, unspecified, not applicable or unspecified: Secondary | ICD-10-CM

## 2013-07-19 DIAGNOSIS — O350XX0 Maternal care for (suspected) central nervous system malformation in fetus, not applicable or unspecified: Secondary | ICD-10-CM | POA: Insufficient documentation

## 2013-07-20 ENCOUNTER — Other Ambulatory Visit (HOSPITAL_COMMUNITY): Payer: Self-pay | Admitting: Unknown Physician Specialty

## 2013-07-20 DIAGNOSIS — O99340 Other mental disorders complicating pregnancy, unspecified trimester: Secondary | ICD-10-CM

## 2013-07-20 DIAGNOSIS — O365990 Maternal care for other known or suspected poor fetal growth, unspecified trimester, not applicable or unspecified: Secondary | ICD-10-CM

## 2013-07-20 DIAGNOSIS — O3500X0 Maternal care for (suspected) central nervous system malformation or damage in fetus, unspecified, not applicable or unspecified: Secondary | ICD-10-CM

## 2013-07-20 DIAGNOSIS — O350XX0 Maternal care for (suspected) central nervous system malformation in fetus, not applicable or unspecified: Secondary | ICD-10-CM

## 2013-07-20 DIAGNOSIS — O2620 Pregnancy care for patient with recurrent pregnancy loss, unspecified trimester: Secondary | ICD-10-CM

## 2013-07-26 ENCOUNTER — Encounter (HOSPITAL_COMMUNITY): Payer: Self-pay

## 2013-07-26 ENCOUNTER — Ambulatory Visit (HOSPITAL_COMMUNITY)
Admission: RE | Admit: 2013-07-26 | Discharge: 2013-07-26 | Disposition: A | Discharge status: Home / Self Care | Payer: Medicaid Other | Source: Ambulatory Visit | Attending: Unknown Physician Specialty | Admitting: Unknown Physician Specialty

## 2013-07-26 VITALS — BP 122/76 | HR 102 | Wt 154.0 lb

## 2013-07-26 DIAGNOSIS — O99340 Other mental disorders complicating pregnancy, unspecified trimester: Secondary | ICD-10-CM | POA: Insufficient documentation

## 2013-07-26 DIAGNOSIS — O3500X0 Maternal care for (suspected) central nervous system malformation or damage in fetus, unspecified, not applicable or unspecified: Secondary | ICD-10-CM | POA: Diagnosis not present

## 2013-07-26 DIAGNOSIS — O365990 Maternal care for other known or suspected poor fetal growth, unspecified trimester, not applicable or unspecified: Secondary | ICD-10-CM | POA: Insufficient documentation

## 2013-07-26 DIAGNOSIS — O350XX0 Maternal care for (suspected) central nervous system malformation in fetus, not applicable or unspecified: Secondary | ICD-10-CM | POA: Insufficient documentation

## 2013-07-26 DIAGNOSIS — O2620 Pregnancy care for patient with recurrent pregnancy loss, unspecified trimester: Secondary | ICD-10-CM | POA: Insufficient documentation

## 2013-08-02 ENCOUNTER — Other Ambulatory Visit (HOSPITAL_COMMUNITY): Payer: Self-pay | Admitting: Unknown Physician Specialty

## 2013-08-02 ENCOUNTER — Ambulatory Visit (HOSPITAL_COMMUNITY)
Admission: RE | Admit: 2013-08-02 | Discharge: 2013-08-02 | Disposition: A | Discharge status: Home / Self Care | Payer: Medicaid Other | Source: Ambulatory Visit | Attending: Unknown Physician Specialty | Admitting: Unknown Physician Specialty

## 2013-08-02 ENCOUNTER — Encounter (HOSPITAL_COMMUNITY): Payer: Self-pay

## 2013-08-02 DIAGNOSIS — O365990 Maternal care for other known or suspected poor fetal growth, unspecified trimester, not applicable or unspecified: Secondary | ICD-10-CM

## 2013-08-02 DIAGNOSIS — O2620 Pregnancy care for patient with recurrent pregnancy loss, unspecified trimester: Secondary | ICD-10-CM | POA: Insufficient documentation

## 2013-08-02 DIAGNOSIS — O350XX0 Maternal care for (suspected) central nervous system malformation in fetus, not applicable or unspecified: Secondary | ICD-10-CM | POA: Insufficient documentation

## 2013-08-02 DIAGNOSIS — O358XX0 Maternal care for other (suspected) fetal abnormality and damage, not applicable or unspecified: Secondary | ICD-10-CM | POA: Insufficient documentation

## 2013-08-02 DIAGNOSIS — O3500X0 Maternal care for (suspected) central nervous system malformation or damage in fetus, unspecified, not applicable or unspecified: Secondary | ICD-10-CM

## 2013-08-02 DIAGNOSIS — O99340 Other mental disorders complicating pregnancy, unspecified trimester: Secondary | ICD-10-CM | POA: Insufficient documentation

## 2013-08-02 NOTE — Progress Notes (Signed)
[facility] ultrasound  Indication: 28 yr old [id number] at [redacted]w[redacted]d with fetal ventriculomegaly and suspected absent CSP with growth restriction for Doppler studies and BPP.  Findings: 1. Single intrauterine pregnancy. 2. Posterior placenta without evidence of previa. 3. Normal amniotic fluid index. 4. Normal umbilical artery Doppler studies; although S/D ratio is at the 90th%. 5. Normal biophysical profile of 8/8.  Recommendations: 1.Fetal growth restriction: - previously counseled - s/p betamethasone - recommend fetal growth in 1 week - recommend repeat Doppler studies weekly - recommend continue twice weekly NSTs 2. Ventriculomegaly: - previously counseled - had normal fetal karyotype on amniocentesis - amniotic fluid negative for toxoplasmosis and CMV - has appointment with Pediatric Neurosurgery - stable in size 3. Suspected absent cavum: - previously counseled  [place], MD

## 2013-08-09 ENCOUNTER — Other Ambulatory Visit (HOSPITAL_COMMUNITY): Payer: Self-pay | Admitting: Unknown Physician Specialty

## 2013-08-09 ENCOUNTER — Ambulatory Visit (HOSPITAL_COMMUNITY): Admission: RE | Admit: 2013-08-09 | Payer: Medicaid Other | Source: Ambulatory Visit

## 2013-08-09 ENCOUNTER — Ambulatory Visit (HOSPITAL_COMMUNITY)
Admission: RE | Admit: 2013-08-09 | Discharge: 2013-08-09 | Disposition: A | Discharge status: Home / Self Care | Payer: Medicaid Other | Source: Ambulatory Visit | Attending: Unknown Physician Specialty | Admitting: Unknown Physician Specialty

## 2013-08-09 DIAGNOSIS — O2620 Pregnancy care for patient with recurrent pregnancy loss, unspecified trimester: Secondary | ICD-10-CM | POA: Diagnosis not present

## 2013-08-09 DIAGNOSIS — O350XX0 Maternal care for (suspected) central nervous system malformation in fetus, not applicable or unspecified: Secondary | ICD-10-CM | POA: Diagnosis present

## 2013-08-09 DIAGNOSIS — Z3689 Encounter for other specified antenatal screening: Secondary | ICD-10-CM | POA: Diagnosis not present

## 2013-08-09 DIAGNOSIS — O365990 Maternal care for other known or suspected poor fetal growth, unspecified trimester, not applicable or unspecified: Secondary | ICD-10-CM | POA: Insufficient documentation

## 2013-08-09 DIAGNOSIS — O3500X0 Maternal care for (suspected) central nervous system malformation or damage in fetus, unspecified, not applicable or unspecified: Secondary | ICD-10-CM

## 2013-08-09 DIAGNOSIS — O99340 Other mental disorders complicating pregnancy, unspecified trimester: Secondary | ICD-10-CM

## 2013-08-16 ENCOUNTER — Ambulatory Visit (HOSPITAL_COMMUNITY)
Admission: RE | Admit: 2013-08-16 | Discharge: 2013-08-16 | Disposition: A | Discharge status: Home / Self Care | Payer: Medicaid Other | Source: Ambulatory Visit | Attending: Unknown Physician Specialty | Admitting: Unknown Physician Specialty

## 2013-08-16 ENCOUNTER — Encounter (HOSPITAL_COMMUNITY): Payer: Self-pay

## 2013-08-16 DIAGNOSIS — O99340 Other mental disorders complicating pregnancy, unspecified trimester: Secondary | ICD-10-CM | POA: Insufficient documentation

## 2013-08-16 DIAGNOSIS — O350XX0 Maternal care for (suspected) central nervous system malformation in fetus, not applicable or unspecified: Secondary | ICD-10-CM

## 2013-08-16 DIAGNOSIS — O2620 Pregnancy care for patient with recurrent pregnancy loss, unspecified trimester: Secondary | ICD-10-CM | POA: Diagnosis not present

## 2013-08-16 DIAGNOSIS — O3500X0 Maternal care for (suspected) central nervous system malformation or damage in fetus, unspecified, not applicable or unspecified: Secondary | ICD-10-CM | POA: Insufficient documentation

## 2013-08-16 DIAGNOSIS — O365990 Maternal care for other known or suspected poor fetal growth, unspecified trimester, not applicable or unspecified: Secondary | ICD-10-CM | POA: Diagnosis not present

## 2013-08-16 NOTE — ED Notes (Signed)
Pt for BPP and dopplers today.

## 2013-08-23 ENCOUNTER — Ambulatory Visit (HOSPITAL_COMMUNITY)
Admission: RE | Admit: 2013-08-23 | Discharge: 2013-08-23 | Disposition: A | Discharge status: Home / Self Care | Payer: Medicaid Other | Source: Ambulatory Visit | Attending: Obstetrics and Gynecology | Admitting: Obstetrics and Gynecology

## 2013-08-23 ENCOUNTER — Encounter (HOSPITAL_COMMUNITY): Payer: Self-pay

## 2013-08-23 ENCOUNTER — Other Ambulatory Visit (HOSPITAL_COMMUNITY): Payer: Self-pay | Admitting: Unknown Physician Specialty

## 2013-08-23 DIAGNOSIS — O2620 Pregnancy care for patient with recurrent pregnancy loss, unspecified trimester: Secondary | ICD-10-CM

## 2013-08-23 DIAGNOSIS — O365990 Maternal care for other known or suspected poor fetal growth, unspecified trimester, not applicable or unspecified: Secondary | ICD-10-CM

## 2013-08-23 DIAGNOSIS — O3500X0 Maternal care for (suspected) central nervous system malformation or damage in fetus, unspecified, not applicable or unspecified: Secondary | ICD-10-CM

## 2013-08-23 DIAGNOSIS — O350XX0 Maternal care for (suspected) central nervous system malformation in fetus, not applicable or unspecified: Secondary | ICD-10-CM | POA: Diagnosis present

## 2013-08-23 DIAGNOSIS — O99340 Other mental disorders complicating pregnancy, unspecified trimester: Secondary | ICD-10-CM

## 2013-12-13 ENCOUNTER — Encounter (HOSPITAL_COMMUNITY): Payer: Self-pay

## 2014-01-29 ENCOUNTER — Emergency Department (HOSPITAL_COMMUNITY)
Admission: EM | Admit: 2014-01-29 | Discharge: 2014-01-29 | Disposition: A | Discharge status: Home / Self Care | Payer: Medicaid Other | Attending: Emergency Medicine | Admitting: Emergency Medicine

## 2014-01-29 ENCOUNTER — Encounter (HOSPITAL_COMMUNITY): Payer: Self-pay | Admitting: Emergency Medicine

## 2014-01-29 ENCOUNTER — Emergency Department (HOSPITAL_COMMUNITY): Payer: Medicaid Other

## 2014-01-29 DIAGNOSIS — T148XXA Other injury of unspecified body region, initial encounter: Secondary | ICD-10-CM

## 2014-01-29 DIAGNOSIS — W01198A Fall on same level from slipping, tripping and stumbling with subsequent striking against other object, initial encounter: Secondary | ICD-10-CM | POA: Insufficient documentation

## 2014-01-29 DIAGNOSIS — Z720 Tobacco use: Secondary | ICD-10-CM | POA: Diagnosis not present

## 2014-01-29 DIAGNOSIS — S3992XA Unspecified injury of lower back, initial encounter: Secondary | ICD-10-CM | POA: Insufficient documentation

## 2014-01-29 DIAGNOSIS — Z8719 Personal history of other diseases of the digestive system: Secondary | ICD-10-CM | POA: Diagnosis not present

## 2014-01-29 DIAGNOSIS — F419 Anxiety disorder, unspecified: Secondary | ICD-10-CM | POA: Insufficient documentation

## 2014-01-29 DIAGNOSIS — Z3202 Encounter for pregnancy test, result negative: Secondary | ICD-10-CM | POA: Diagnosis not present

## 2014-01-29 DIAGNOSIS — Z79899 Other long term (current) drug therapy: Secondary | ICD-10-CM | POA: Diagnosis not present

## 2014-01-29 DIAGNOSIS — Z792 Long term (current) use of antibiotics: Secondary | ICD-10-CM | POA: Insufficient documentation

## 2014-01-29 DIAGNOSIS — G8929 Other chronic pain: Secondary | ICD-10-CM | POA: Insufficient documentation

## 2014-01-29 DIAGNOSIS — Y9389 Activity, other specified: Secondary | ICD-10-CM | POA: Diagnosis not present

## 2014-01-29 DIAGNOSIS — Z91040 Latex allergy status: Secondary | ICD-10-CM | POA: Diagnosis not present

## 2014-01-29 DIAGNOSIS — Y929 Unspecified place or not applicable: Secondary | ICD-10-CM | POA: Diagnosis not present

## 2014-01-29 DIAGNOSIS — W19XXXA Unspecified fall, initial encounter: Secondary | ICD-10-CM

## 2014-01-29 DIAGNOSIS — F329 Major depressive disorder, single episode, unspecified: Secondary | ICD-10-CM | POA: Insufficient documentation

## 2014-01-29 DIAGNOSIS — Y998 Other external cause status: Secondary | ICD-10-CM | POA: Insufficient documentation

## 2014-01-29 HISTORY — DX: Other chronic pain: G89.29

## 2014-01-29 HISTORY — DX: Dorsalgia, unspecified: M54.9

## 2014-01-29 LAB — POC URINE PREG, ED: Preg Test, Ur: NEGATIVE

## 2014-01-29 MED ORDER — NAPROXEN 250 MG PO TABS
500.0000 mg | ORAL_TABLET | Freq: Once | ORAL | Status: AC
  Administered 2014-01-29: 500 mg via ORAL
  Filled 2014-01-29: qty 2

## 2014-01-29 MED ORDER — ONDANSETRON HCL 4 MG PO TABS
4.0000 mg | ORAL_TABLET | Freq: Once | ORAL | Status: AC
  Administered 2014-01-29: 4 mg via ORAL
  Filled 2014-01-29: qty 1

## 2014-01-29 MED ORDER — IBUPROFEN 600 MG PO TABS: ORAL_TABLET | ORAL | Status: DC

## 2014-01-29 MED ORDER — CYCLOBENZAPRINE HCL 10 MG PO TABS: 10.0000 mg | ORAL_TABLET | Freq: Three times a day (TID) | ORAL | Status: DC | PRN

## 2014-01-29 MED ORDER — DIAZEPAM 5 MG PO TABS
5.0000 mg | ORAL_TABLET | Freq: Once | ORAL | Status: AC
  Administered 2014-01-29: 5 mg via ORAL
  Filled 2014-01-29: qty 1

## 2014-01-29 MED ORDER — HYDROCODONE-ACETAMINOPHEN 5-325 MG PO TABS
2.0000 | ORAL_TABLET | Freq: Once | ORAL | Status: AC
  Administered 2014-01-29: 2 via ORAL
  Filled 2014-01-29: qty 2

## 2014-01-29 MED ORDER — HYDROMORPHONE HCL 1 MG/ML IJ SOLN
1.0000 mg | Freq: Once | INTRAMUSCULAR | Status: AC
  Administered 2014-01-29: 1 mg via INTRAMUSCULAR
  Filled 2014-01-29: qty 1

## 2014-01-29 NOTE — Discharge Instructions (Signed)
Your CT scan is negative for fracture, dislocation, or change in disc space. Please use heat to your lower back and possible. Please rest your back is much as possible. Use Flexeril 3 times daily for spasm type pain, use ibuprofen 600 mg with each meal and at bedtime. Please see your primary physician for additional evaluation and management. Muscle Strain A muscle strain (pulled muscle) happens when a muscle is stretched beyond normal length. It happens when a sudden, violent force stretches your muscle too far. Usually, a few of the fibers in your muscle are torn. Muscle strain is common in athletes. Recovery usually takes 1-2 weeks. Complete healing takes 5-6 weeks.  HOME CARE   Follow the PRICE method of treatment to help your injury get better. Do this the first 2-3 days after the injury:  Protect. Protect the muscle to keep it from getting injured again.  Rest. Limit your activity and rest the injured body part.  Ice. Put ice in a plastic bag. Place a towel between your skin and the bag. Then, apply the ice and leave it on from 15-20 minutes each hour. After the third day, switch to moist heat packs.  Compression. Use a splint or elastic bandage on the injured area for comfort. Do not put it on too tightly.  Elevate. Keep the injured body part above the level of your heart.  Only take medicine as told by your doctor.  Warm up before doing exercise to prevent future muscle strains. GET HELP IF:   You have more pain or puffiness (swelling) in the injured area.  You feel numbness, tingling, or notice a loss of strength in the injured area. MAKE SURE YOU:   Understand these instructions.  Will watch your condition.  Will get help right away if you are not doing well or get worse. Document Released: 11/07/2007 Document Revised: 11/18/2012 Document Reviewed: 08/27/2012 [facility] Patient Information 2015 [place], [place]. This information is not intended to replace advice given to you  by your health care provider. Make sure you discuss any questions you have with your health care provider.

## 2014-01-29 NOTE — ED Notes (Signed)
Patient c/o right lower back pain. Per patient was carrying 66 month old child today "when back locked up," causing patient to fall. Patient reports prior incidents in which back "locked up" but patient states "never this bad." Patient reports hitting head but denies LOC, dizziness or blurred vision. Denies taking any medication for pain.

## 2014-01-29 NOTE — ED Provider Notes (Signed)
CSN: [id number]     Arrival date & time 01/29/14  1321 History   First MD Initiated Contact with Patient 01/29/14 1333     Chief Complaint  Patient presents with  . Back Pain     (Consider location/radiation/quality/duration/timing/severity/associated sxs/prior Treatment) HPI Comments: Patient states she was carrying her 34-month-old child today when her back locked up. The patient states that she fell from a standing position and now has right lower back pain. It is of note that the patient suffers from sciatica and has a chronic back problem. The patient states that usually she has a pain that at times feels like a throbbing toothache type pain. She states that now she has pain that feels like baseball bats hitting her in the back and hot poker sticking in her back. The pain is worse with movement or attempting to walk. The patient states that the pain is never "this bad". She has not taken any medication for this problem, and presents now to emerge department for assistance.  The history is provided by the patient.    Past Medical History  Diagnosis Date  . Sciatica   . Depression   . Anxiety   . Crohn's disease   . Chronic back pain    Past Surgical History  Procedure Laterality Date  . Bunionectomy    . Cholecystectomy    . Mouth surgery     History reviewed. No pertinent family history. History  Substance Use Topics  . Smoking status: Current Every Day Smoker -- 0.25 packs/day for 5 years    Types: Cigarettes  . Smokeless tobacco: Never Used  . Alcohol Use: No   OB History    Gravida Para Term Preterm AB TAB SAB Ectopic Multiple Living   8 3 2 1 4  0 4 0 0 3     Review of Systems  Constitutional: Negative for activity change.       All ROS Neg except as noted in HPI  HENT: Negative for nosebleeds.   Eyes: Negative for photophobia and discharge.  Respiratory: Negative for cough, shortness of breath and wheezing.   Cardiovascular: Negative for chest pain and  palpitations.  Gastrointestinal: Negative for abdominal pain and blood in stool.  Genitourinary: Negative for dysuria, frequency and hematuria.  Musculoskeletal: Positive for back pain. Negative for arthralgias and neck pain.  Skin: Negative.   Neurological: Negative for dizziness, seizures and speech difficulty.  Psychiatric/Behavioral: Negative for hallucinations and confusion. The patient is nervous/anxious.       Allergies  Onion; Other; Paroxetine; Paxil; Prednisone; Propoxyphene; and Latex  Home Medications   Prior to Admission medications   Medication Sig Start Date End Date Taking? Authorizing Provider  FLUoxetine (PROZAC) 20 MG capsule Take 20 mg by mouth daily.   Yes Historical Provider, MD  traZODone (DESYREL) 150 MG tablet Take 150 mg by mouth at bedtime.    Yes Historical Provider, MD  acetaminophen (TYLENOL) 500 MG tablet Take 1,000 mg by mouth 4 (four) times daily as needed for headache.    Historical Provider, MD  clindamycin (CLEOCIN) 150 MG capsule Take 2 capsules (300 mg total) by mouth 3 (three) times daily. Patient not taking: Reported on 01/29/2014 07/07/13   Kassie Mends, MD  Prenatal Vit-Fe Fumarate-FA (PRENATAL COMPLETE) 14-0.4 MG TABS Take 1 tablet by mouth daily. Patient not taking: Reported on 01/29/2014 01/15/13   Ezequiel Essex, MD   BP 123/88 mmHg  Pulse 84  Temp(Src) 98.2 F (36.8 C) (Oral)  Resp  20  SpO2 98%  LMP 01/29/2014  Breastfeeding? Unknown Physical Exam  ED Course  Procedures (including critical care time) Labs Review Labs Reviewed - No data to display  Imaging Review No results found.   EKG Interpretation None      MDM  Pt has pain with change of position and with palpation of the lower back, right worse than left. No reported postpartum depression. Pt treated with ice pack, naproxen, valium, and norco.  2:50 - pt reports minimal improvement with pain meds. IM Dilaudid given.  3:34 - Pt having more spasm. Pt crying in  pain. Difficult to evaluate neuro exam due to pain, but no gross changes noted. CT l spine ordered.  CT scan is negative for acute fracture or dislocation. There is no mass or evidence of casts in the lower back area.  Suspect the patient has a muscle strain. I suggested to the patient that since she's had a previous episode of "back locking up", that she see one of the orthopedic specialist for additional evaluation and management. Prescription for Flexeril and ibuprofen given to the patient.    Final diagnoses:  Injury of back due to fall, initial encounter  Muscle strain    *I have reviewed nursing notes, vital signs, and all appropriate lab and imaging results for this patient.[address], PA-C 01/31/14 1802  Mariea Clonts, MD 02/01/14 0111

## 2014-04-10 NOTE — ED Provider Notes (Signed)
PHYSICAL EXAM FOR 01/29/14 VISIT.  Vital signs noted.  Head - [place] and AT Ears - EAC clear. TM without bulging Eyes - PERRLA , EOMI, Lids symmetrical Nose - no bleeding. Mild congestion present Mouth - oral pharynx clear. Uvula midline. Airway patent Neck - Good ROM. No cervical lymphadenopathy. No carotid bruits Lungs - symmetrical rise and fall of the chest. No focal breath sound changes. Pt speaks in complete sentences.  Heart - RRR, no rub or gallop. Distal pulses full. Abd. - Soft with good bowel sounds. No guarding. No mass. Back - Pain and some spasm of the lower lumbar region right greater than left. No palpable step off. No hot areas . SLR on the right at 30 degrees. Ext. No hot joints or effusions. No deformity. Neuro - No gross neuro deficits. Motor exam of the lower extremities limited due to pain. No muscle atrophy or flaccid extremities noted.  Skin- Warm and Dry. No rash.  Lenox Ahr, PA-C 04/10/14 2052  Mariea Clonts, MD 04/11/14 [phone]

## 2014-05-27 ENCOUNTER — Encounter (HOSPITAL_COMMUNITY): Payer: Self-pay | Admitting: *Deleted

## 2014-05-27 ENCOUNTER — Emergency Department (HOSPITAL_COMMUNITY)
Admission: EM | Admit: 2014-05-27 | Discharge: 2014-05-27 | Discharge status: Against Medical Advice | Payer: Medicaid Other | Attending: Emergency Medicine | Admitting: Emergency Medicine

## 2014-05-27 DIAGNOSIS — R109 Unspecified abdominal pain: Secondary | ICD-10-CM | POA: Insufficient documentation

## 2014-05-27 DIAGNOSIS — G8929 Other chronic pain: Secondary | ICD-10-CM | POA: Diagnosis not present

## 2014-05-27 DIAGNOSIS — Z720 Tobacco use: Secondary | ICD-10-CM | POA: Insufficient documentation

## 2014-05-27 LAB — URINE MICROSCOPIC-ADD ON

## 2014-05-27 LAB — URINALYSIS, ROUTINE W REFLEX MICROSCOPIC
Bilirubin Urine: NEGATIVE
Glucose, UA: NEGATIVE mg/dL
Hgb urine dipstick: NEGATIVE
Ketones, ur: NEGATIVE mg/dL
Nitrite: NEGATIVE
PROTEIN: NEGATIVE mg/dL
SPECIFIC GRAVITY, URINE: 1.025 (ref 1.005–1.030)
Urobilinogen, UA: 0.2 mg/dL (ref 0.0–1.0)
pH: 6 (ref 5.0–8.0)

## 2014-05-27 LAB — PREGNANCY, URINE: Preg Test, Ur: NEGATIVE

## 2014-05-27 NOTE — ED Notes (Signed)
Pt co pelvic pain, bladder pain, pain on urination, and rt sided abdominal pain x2 days.

## 2014-05-27 NOTE — ED Notes (Signed)
Registration called and stated pt left.

## 2015-01-01 IMAGING — US US FETAL BPP W/O NONSTRESS
1 series · 8 of 8 positions shown · non-contrast
Comparison: none

[Series 1: us fetal bpp w/o nonstress · 8 of 8 slices shown]
[im 1/8]
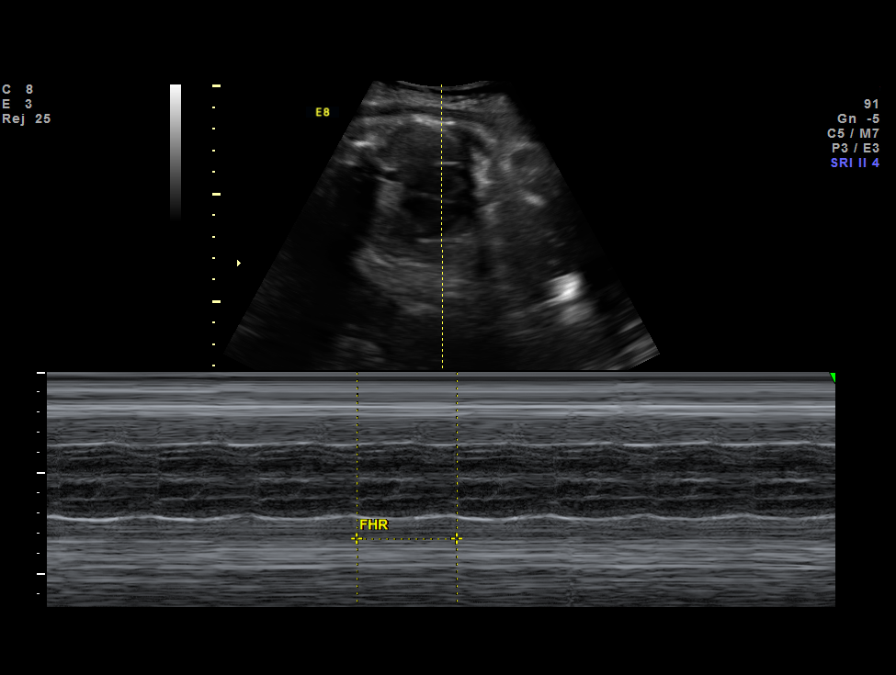
[im 2/8]
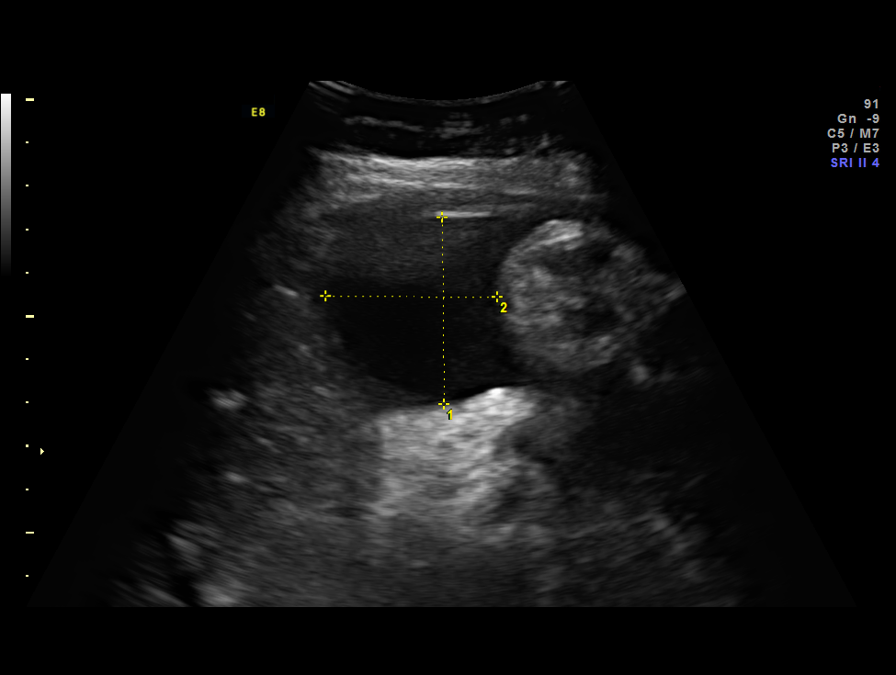
[im 3/8]
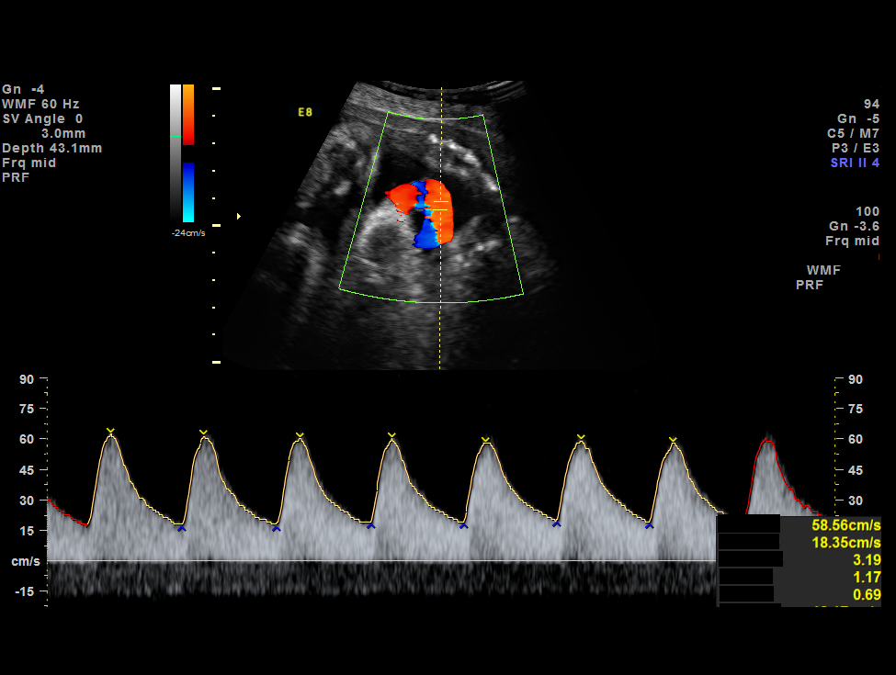
[im 4/8]
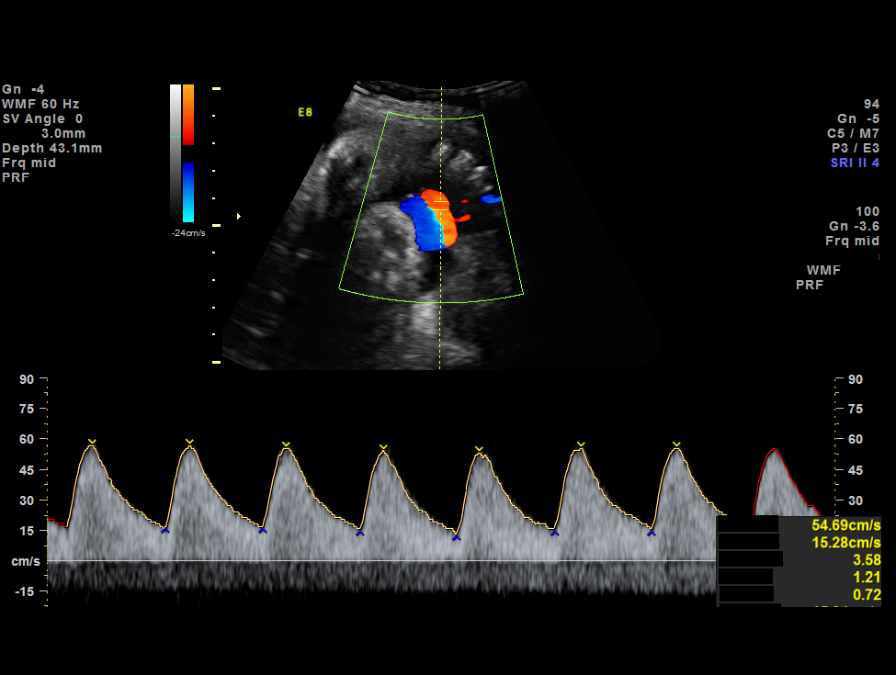
[im 5/8]
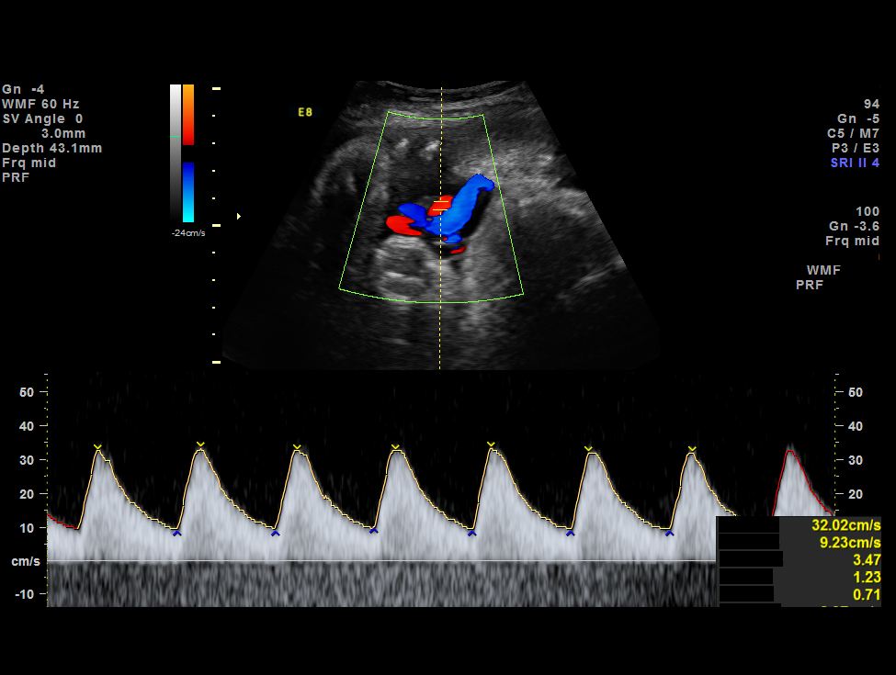
[im 6/8]
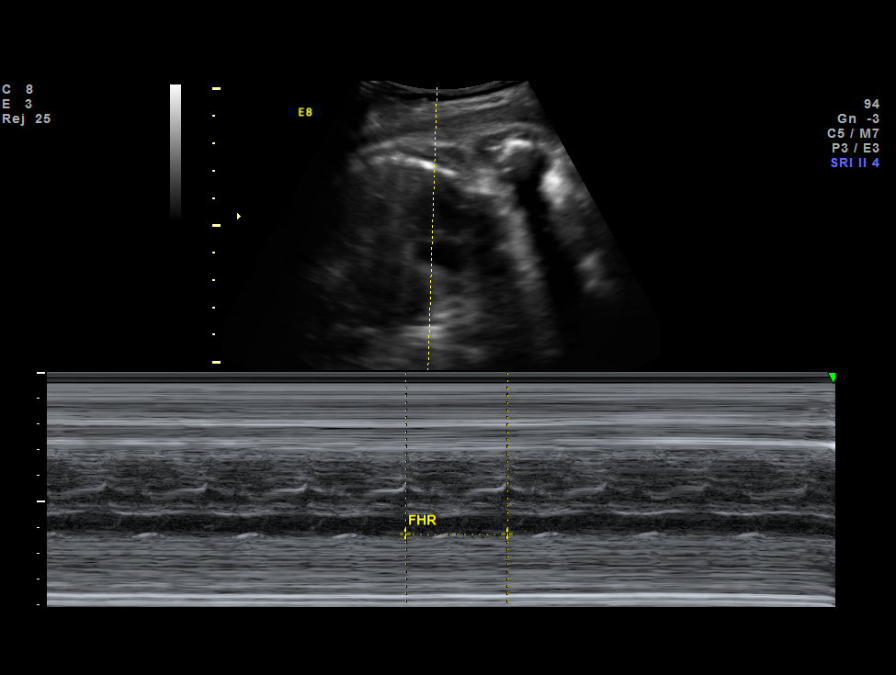
[im 7/8]
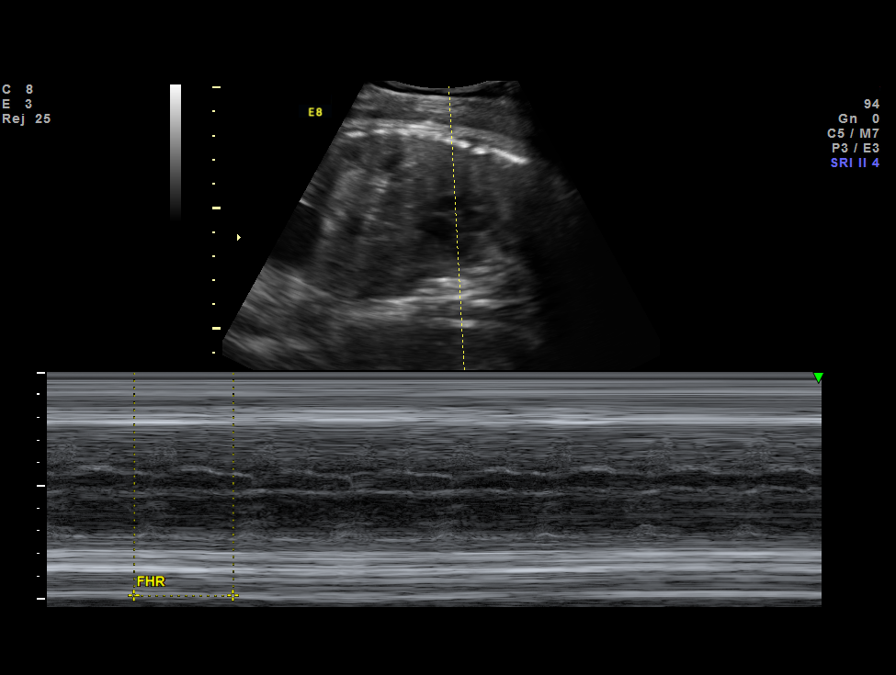
[im 8/8]
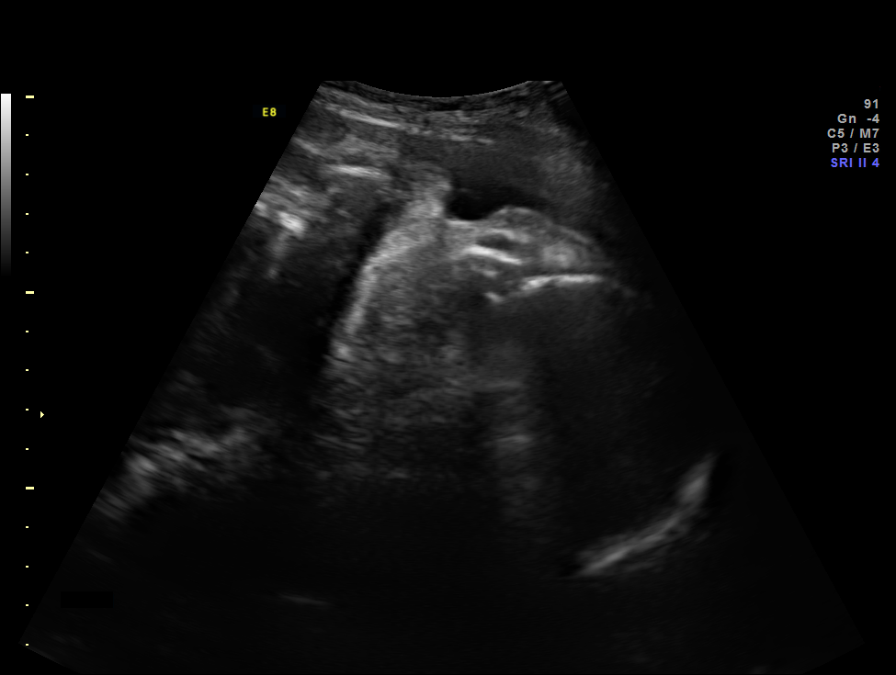

[8 of 8 positions shown; findings below may reference images not displayed]

OBSTETRICS REPORT
                      (Signed Final 08/16/2013 [DATE])

Service(s) Provided

 US UA CORD DOPPLER                                    76820.0
Indications

 Elevated UA Dopplers
 Cerebral ventriculomegaly - mild; amnio: 46XX ,
 negative AFP,  CMV and Toxo; normal fetal ECHO
 Fetal growth restriction
 Cigarette smoker
 Poor obstetric history-Recurrent (habitual) abortion
 (3 consecutive ab's)
 Non-reactive NST
Fetal Evaluation

 Num Of Fetuses:    1
 Fetal Heart Rate:  124                          bpm
 Cardiac Activity:  Observed
 Presentation:      Cephalic

 Amniotic Fluid
 AFI FV:      Subjectively within normal limits
                                             Larg Pckt:     4.3  cm
Biophysical Evaluation

 Amniotic F.V:   Pocket => 2 cm two         F. Tone:        Observed
                 planes
 F. Movement:    Observed                   Score:          [DATE]
 F. Breathing:   Not Observed
Gestational Age

 LMP:           35w 3d        Date:  12/11/12                 EDD:   09/17/13
 Best:          35w 3d     Det. By:  LMP  (12/11/12)          EDD:   09/17/13
Doppler - Fetal Vessels

 Umbilical Artery
 S/D:   3.41           92  %tile       RI:
 PI:    1.2                            PSV:       58.56   cm/s
 Umbilical Artery
 Absent DFV:    No     Reverse DFV:    No

Impression

 Single IUP at  11w1d
 Detal ventriculomegaly, suspected absent CSP and fetal
 growth restriction
 BPP [DATE] (-2 for absent breathing movement, -2 for NR NST)
 Normal amniotic fluid volume
 UA Dopplers elevated (92nd %tile) but no AEDF or REDF
Recommendations

 prolonged monitoring and repeat BPP in 4-6 hours.
 Would move toward delivery is NST is non reassuring or
 repeat BPP < [DATE]

## 2015-06-16 IMAGING — CT CT L SPINE W/O CM
3 series · 12 of 33 positions shown, 14 images · non-contrast
Comparison: None.

CLINICAL DATA: right lower back pain. Per patient was carrying 5
month old child today "when back locked up," causing patient to
fall. Patient reports prior incidents in which back "locked up" but
patient states "never this bad." Patient reports hitting head but
denies LOC, dizziness or blurred vision.

EXAM:
CT LUMBAR SPINE WITHOUT CONTRAST
TECHNIQUE: Multidetector CT imaging of the lumbar spine was performed without
intravenous contrast administration. Multiplanar CT image
reconstructions were also generated.

[Series 3: lumbar spine 2.0 b30s · axial · 0.36mm/px · z∈[-330,-174]mm · 4 of 114 slices shown, 5 images]
[im 18/114  soft-tissue]
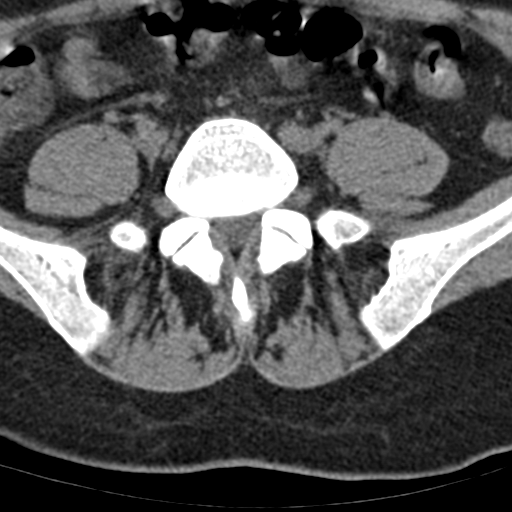
[im 18/114  bone]
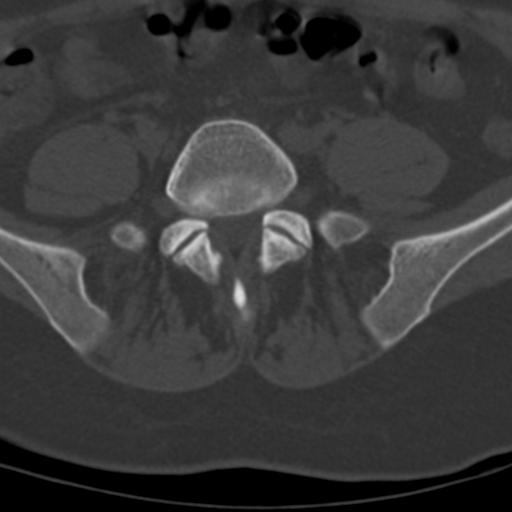
[im 44/114  bone]
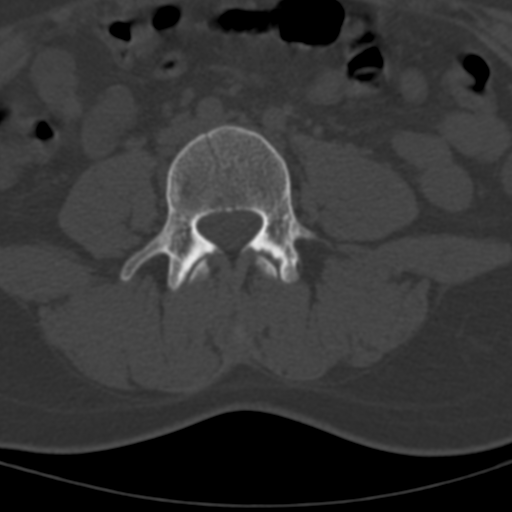
[im 70/114  bone]
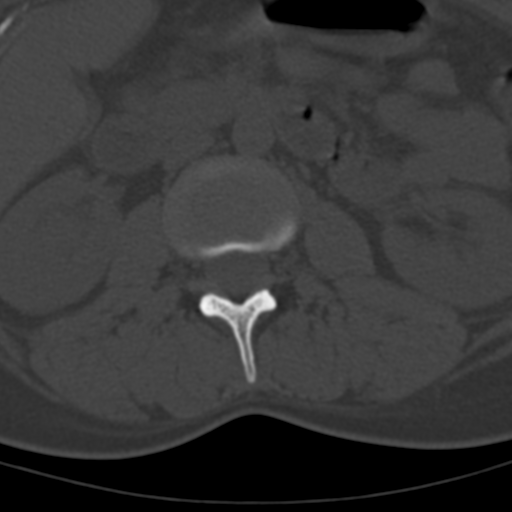
[im 96/114  bone]
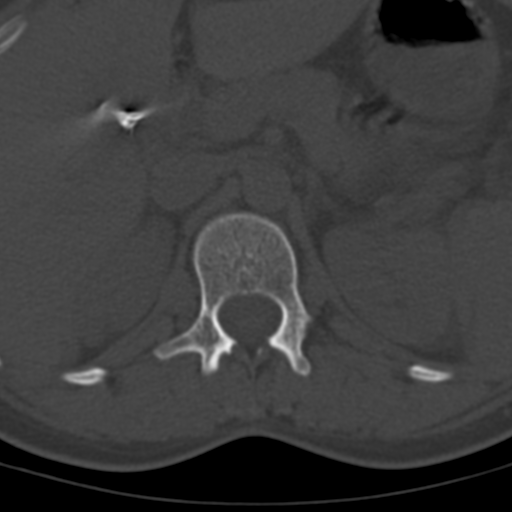

[Series 4: lumbar spine 2.0 spo · coronal · 0.24mm/px · 3 of 76 slices shown (1 of 2)]
[im 16/76  bone]
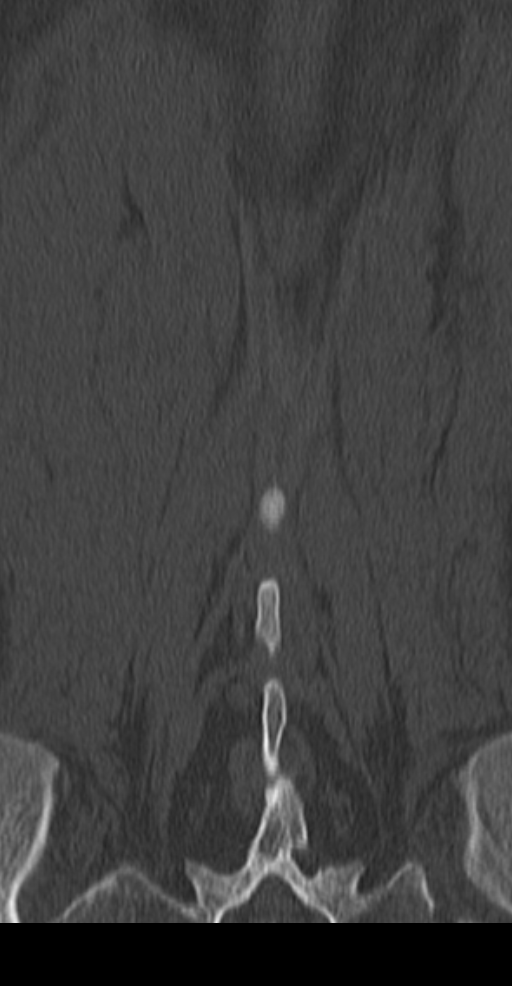
[im 31/76  bone]
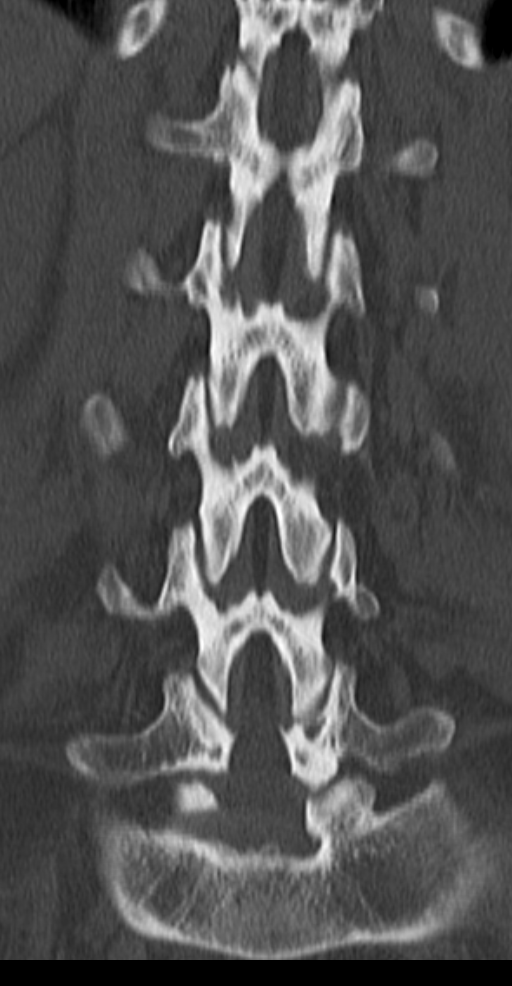
[im 46/76  bone]
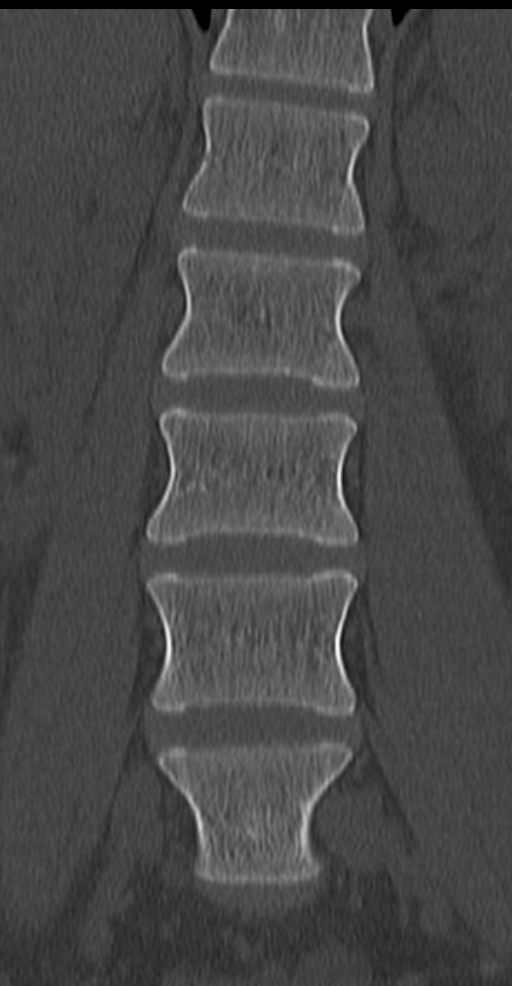

[Series 5: lumbar spine 2.0 spo · sagittal · 0.34mm/px · 5 of 62 slices shown, 6 images (2 of 2)]
[im 21/62  bone]
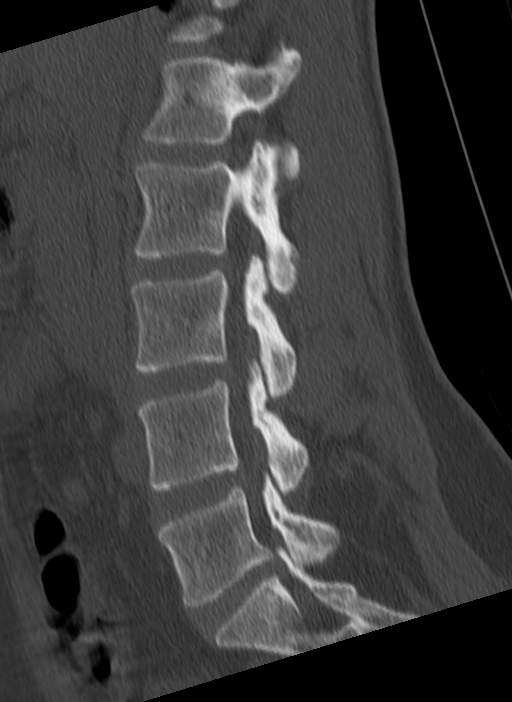
[im 26/62  bone]
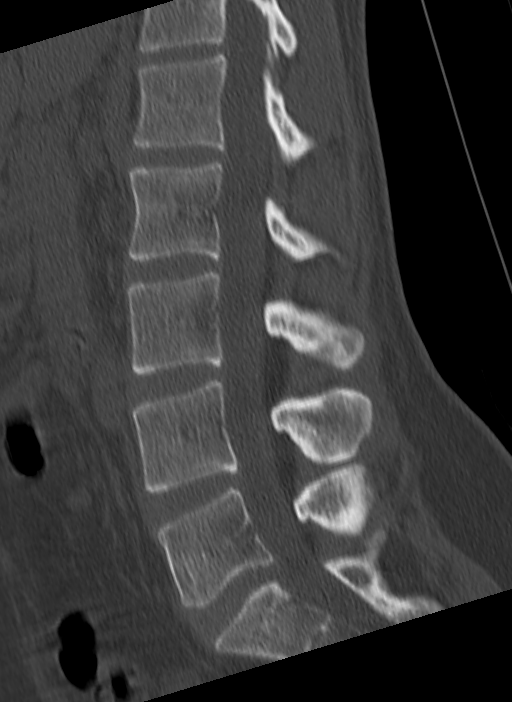
[im 31/62  soft-tissue]
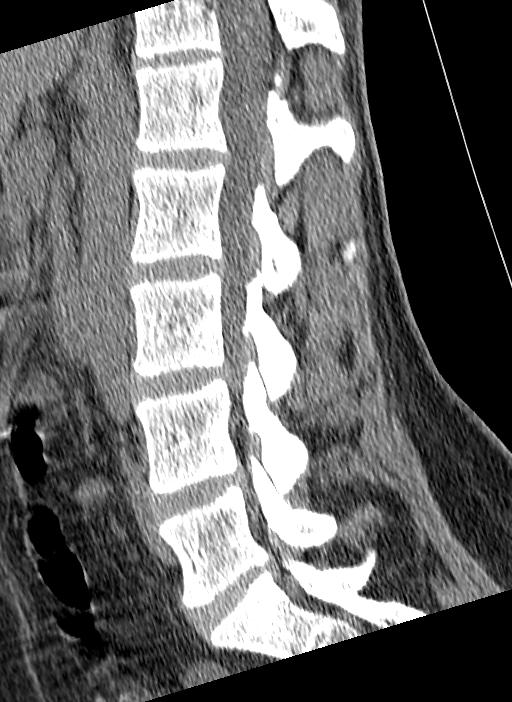
[im 31/62  bone]
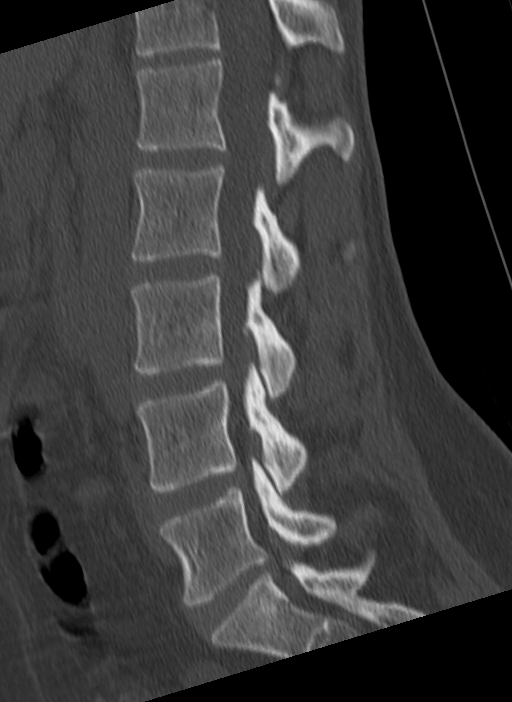
[im 36/62  bone]
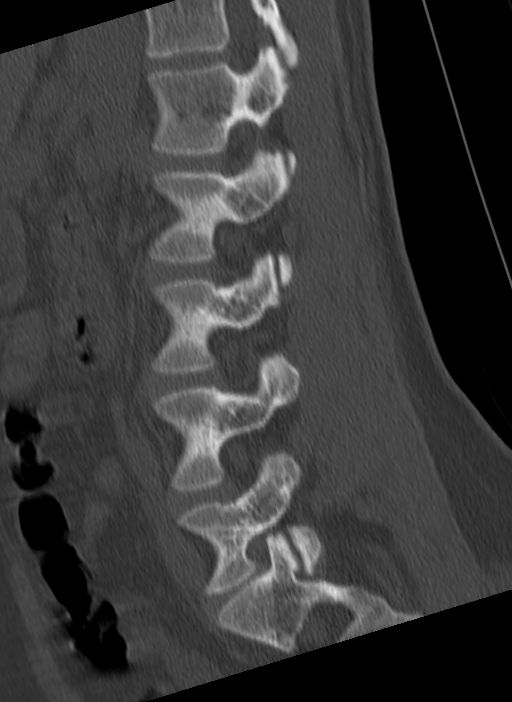
[im 41/62  bone]
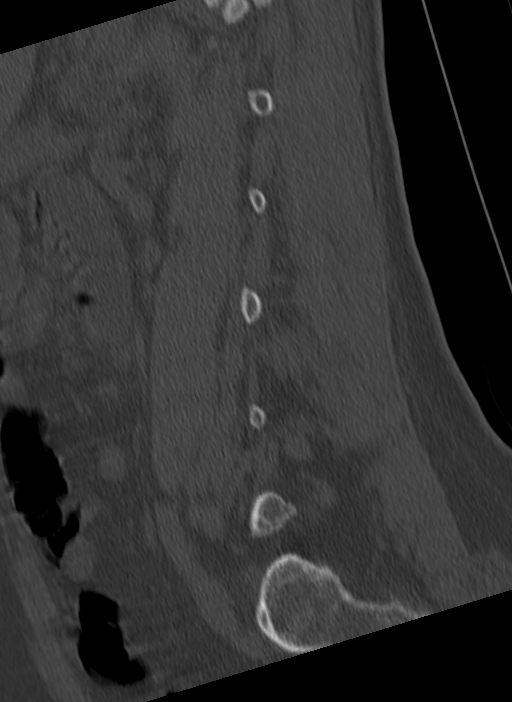

[12 of 33 positions shown; findings below may reference images not displayed]

FINDINGS: Normal alignment of the lumbar vertebral bodies. No loss of
vertebral body height or disc height. Normal facet articulation.
Spinous processes are normal. No evidence of significant disc
protrusion or extrusion.

Incidentally imaged abdomen demonstrates cholecystectomy clips.
Adrenal glands normal. Kidneys demonstrate no calculi or
obstruction.
IMPRESSION: Normal lumbar CT.

## 2016-04-10 ENCOUNTER — Telehealth: Payer: Self-pay | Admitting: Gynecology

## 2016-04-10 ENCOUNTER — Encounter: Payer: Self-pay | Admitting: Gynecology

## 2016-04-10 NOTE — Telephone Encounter (Signed)
Appt has been scheduled for the pt to see Dr. Fermin Schwab on 3/16 at 1030am. The referring office will notify the patient of the appt date and time. Verified the pt's address so that a letter could be mailed.

## 2016-04-26 ENCOUNTER — Ambulatory Visit: Payer: Medicaid Other | Attending: Gynecology | Admitting: Gynecology

## 2016-04-26 ENCOUNTER — Encounter: Payer: Self-pay | Admitting: Gynecology

## 2016-04-26 VITALS — BP 137/96 | HR 82 | Temp 98.3°F | Resp 20 | Ht 67.05 in | Wt 123.0 lb

## 2016-04-26 DIAGNOSIS — C539 Malignant neoplasm of cervix uteri, unspecified: Secondary | ICD-10-CM | POA: Insufficient documentation

## 2016-04-26 DIAGNOSIS — K5090 Crohn's disease, unspecified, without complications: Secondary | ICD-10-CM | POA: Insufficient documentation

## 2016-04-26 DIAGNOSIS — N809 Endometriosis, unspecified: Secondary | ICD-10-CM | POA: Diagnosis present

## 2016-04-26 DIAGNOSIS — F17210 Nicotine dependence, cigarettes, uncomplicated: Secondary | ICD-10-CM | POA: Insufficient documentation

## 2016-04-26 NOTE — Progress Notes (Signed)
Consult Note: Gyn-Onc   [name] 31 y.o. female  Chief Complaint  Patient presents with  . 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 I discussed with the patient and her family the high probability of this is a squamous cell carcinoma cervix stage IB 1. Treatment options including radical hysterectomy with pelvic lymphadenectomy or radiation therapy were outlined. The patient would prefer to undergo radical is recommended pelvic lymphadenectomy. She understands that she'll have a suprapubic catheter in place and it may take several weeks for bladder function to restore be restored. The risks of surgery including hemorrhage, infection, injury to adjacent viscera, anesthetic risks and venous normal [place] complications were all discussed with patient and her family. Their questions are answered. We'll schedule surgery for Tuesday, April 3 at [facility]. She will come to [facility] prior to that for a preoperative visit.   HPI: 31 year old white female gravida 3 para 3 seen in consultation request of Dr. 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  The patient has a long-standing history of irregular menstrual periods. She reports that approximately or years ago she had a Pap smear that was abnormal and a LEEP procedure was recommended. However that time the patient did not have insurance and has not had any further follow-up until being seen at the [facility] on 03/28/2016. At that time she had a Pap smear consistent with squamous cell carcinoma the cervix and a pelvic examination suggesting a lesion on the cervix. Over the last several months she has had increasing pelvic pain. She is previously given a diagnosis of having endometriosis and Crohn's disease. Crohn's disease has not been actively treated the present  time. The patient does have chronic diarrhea. She has used Depo-Provera which she says hopes with pain to some degree. She also uses Aleve daily for pain.    Review of Systems:10 point review of systems is negative except as noted in interval history.   Vitals: Blood pressure (!) 137/96, pulse 82, temperature 98.3 F (36.8 C), temperature source Oral, resp. rate 20, height 5' 7.05" (1.703 m), weight 123 lb (55.8 kg), SpO2 100 %.  Physical Exam: General : The patient is a healthy woman in no acute distress.  HEENT: normocephalic, extraoccular movements normal; neck is supple without thyromegally  Lynphnodes: Supraclavicular and inguinal nodes not enlarged  Abdomen: Soft, non-tender, no ascites, no organomegally, no masses, no hernias  Pelvic:  EGBUS: Normal female  Vagina: Normal, no lesions  Urethra and Bladder: Normal, non-tender  Cervix: Normal size. However, there is also a lesion approximately 1.5 cm on the posterior cervix which is biopsied. Uterus: Anterior normal shape size and consistency there is no tenderness. Bi-manual examination: Non-tender; no adenxal masses or nodularity, there is no parametrial involvement. Rectal: normal sphincter tone, no masses, no blood  Lower extremities: No edema or varicosities. Normal range of motion      Allergies  Allergen Reactions  . Onion Anaphylaxis and Other (See Comments)  . Other Other (See Comments) and Hives    Pink dye Mouth sores  . Paroxetine Other (See Comments)    Hallucinations (intolerance)  . Paxil [Paroxetine Hcl] Other (See Comments)    Hallucinations (intolerance)  . Prednisone Other (See Comments)    Hives and blisters  . Propoxyphene Hives    Pink dye  . Latex Rash  Past Medical History:  Diagnosis Date  . Anxiety   . Chronic back pain   . Crohn's disease ([place])   . Depression   . Sciatica     Past Surgical History:  Procedure Laterality Date  . BUNIONECTOMY    . CHOLECYSTECTOMY    . MOUTH  SURGERY      No current outpatient prescriptions on file.   No current facility-administered medications for this visit.     Social History   Social History  . Marital status: Single    Spouse name: N/A  . Number of children: N/A  . Years of education: N/A   Occupational History  . Not on file.   Social History Main Topics  . Smoking status: Current Every Day Smoker    Packs/day: 0.25    Years: 20.00    Types: Cigarettes  . Smokeless tobacco: Never Used  . Alcohol use No  . Drug use: No  . Sexual activity: Yes    Birth control/ protection: None   Other Topics Concern  . Not on file   Social History Narrative  . No narrative on file    History reviewed. No pertinent family history.    Marti Sleigh, MD 04/26/2016, 11:08 AM

## 2016-04-26 NOTE — Patient Instructions (Signed)
You will receive a call from [facility] regarding a pro- op appointment and surgical date/time.

## 2016-05-02 ENCOUNTER — Telehealth: Payer: Self-pay

## 2016-05-02 NOTE — Telephone Encounter (Signed)
Pt is scheduled for a Radical hysterectomy with pelvic lymphadenectomy with Dr Rush Landmark for May 14, 2016 at [facility] [id number]

## 2016-05-07 DIAGNOSIS — N879 Dysplasia of cervix uteri, unspecified: Secondary | ICD-10-CM | POA: Insufficient documentation

## 2016-05-20 ENCOUNTER — Ambulatory Visit: Payer: Medicaid Other

## 2016-05-20 ENCOUNTER — Encounter: Payer: Self-pay | Admitting: Gynecologic Oncology

## 2016-05-20 ENCOUNTER — Ambulatory Visit: Payer: Medicaid Other | Attending: Gynecologic Oncology | Admitting: Gynecologic Oncology

## 2016-05-20 VITALS — BP 106/94 | HR 97 | Temp 98.4°F | Resp 22 | Wt 124.0 lb

## 2016-05-20 DIAGNOSIS — Z79899 Other long term (current) drug therapy: Secondary | ICD-10-CM | POA: Insufficient documentation

## 2016-05-20 DIAGNOSIS — Z888 Allergy status to other drugs, medicaments and biological substances status: Secondary | ICD-10-CM | POA: Insufficient documentation

## 2016-05-20 DIAGNOSIS — D060 Carcinoma in situ of endocervix: Secondary | ICD-10-CM | POA: Diagnosis not present

## 2016-05-20 DIAGNOSIS — R8299 Other abnormal findings in urine: Secondary | ICD-10-CM | POA: Diagnosis not present

## 2016-05-20 DIAGNOSIS — K5090 Crohn's disease, unspecified, without complications: Secondary | ICD-10-CM | POA: Insufficient documentation

## 2016-05-20 DIAGNOSIS — F17210 Nicotine dependence, cigarettes, uncomplicated: Secondary | ICD-10-CM | POA: Diagnosis not present

## 2016-05-20 DIAGNOSIS — G8918 Other acute postprocedural pain: Secondary | ICD-10-CM

## 2016-05-20 DIAGNOSIS — Z466 Encounter for fitting and adjustment of urinary device: Secondary | ICD-10-CM

## 2016-05-20 DIAGNOSIS — Z90710 Acquired absence of both cervix and uterus: Secondary | ICD-10-CM

## 2016-05-20 DIAGNOSIS — C539 Malignant neoplasm of cervix uteri, unspecified: Secondary | ICD-10-CM

## 2016-05-20 DIAGNOSIS — Z8541 Personal history of malignant neoplasm of cervix uteri: Secondary | ICD-10-CM | POA: Diagnosis not present

## 2016-05-20 MED ORDER — OXYCODONE HCL 10 MG PO TABS: 10.0000 mg | ORAL_TABLET | ORAL | 0 refills | Status: DC | PRN

## 2016-05-20 MED ORDER — OXYCODONE HCL 10 MG PO TABS: 10.0000 mg | ORAL_TABLET | ORAL | 0 refills | Status: AC | PRN

## 2016-05-20 NOTE — Progress Notes (Signed)
Consult Note: Gyn-Onc   Katherine Mack 31 y.o. female  Chief Complaint  Patient presents with  . 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 Low risk for recurrence, no adjuvant therapy recommended.  Plan:  Begin assessing voiding function of bladder - counseled patient about clamping foley x3 hrs or until urge to void, then return catheter to drainage. If <50cc evacuated from foley after a successful void, she can notify our office and we will arrange for removal of suprapubic catheter.  Send Urine culture today, though suspicion for UTI is low. Suspect dark color is secondary to dehydration. Counseled regarding increasing PO liquid intake.  Counseled about pathology report. Discussed low risk factors for recurrence. In accordance with NCCN guidelines, adjuvant therapy is not recommended at this time. However, it is important that she continue vaginal screens for potential recurrent HPV related dysplasia or malignancy. I recommend visits every 6 months to begin with. Paps are recommended annually to evaluate for new HPV related disease.   Appropriate postop pain. Re-prescribed oxycodone.  She has post-op follow-up established with Dr Fermin Schwab later this month.   HPI: 31 year old white female gravida 3 para 3 seen in consultation request of Dr. 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  The patient has a long-standing history of irregular menstrual periods. She reports that approximately or years ago she had a Pap smear that was abnormal and a LEEP procedure was recommended. However that time the patient did not have insurance and has not had any further follow-up until being seen at the [facility] on 03/28/2016. At that time she had a Pap smear consistent with squamous cell carcinoma the cervix and a pelvic examination  suggesting a lesion on the cervix. Over the last several months she has had increasing pelvic pain. She is previously given a diagnosis of having endometriosis and Crohn's disease. Crohn's disease has not been actively treated the present time. The patient does have chronic diarrhea. She has used Depo-Provera which she says hopes with pain to some degree. She also uses Aleve daily for pain.  Interval Hx: A biopsy was taken on 04/26/16 of a visible lesion on the posterior lip of the cervix. 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  On 05/14/16 she underwent radical abdominal hysterectomy, bilateral pelvic lymphadenopathy at [facility] with Dr Fermin Schwab. 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 Nodes were negative and the margins were negative for dysplasia. A suprapubic catheter was placed during the surgery.  Since discharge on 05/16/16 she has continued to have incisional pain for which she is taking tylenol, ibuprofen and oxycodone (10mg ).  She has noted that the color of her urine has become dark and she is "worried about a kidney infection" though she notes no fevers.  Review of Systems:10 point review of systems is negative except as noted in interval history.   Vitals: Blood pressure (!) 106/94, pulse 97, temperature 98.4 F (36.9 C), temperature source Oral, resp. rate (!) 22, weight 124 lb (56.2 kg).  Physical Exam: General : The patient is a healthy woman in no acute distress.  HEENT: normocephalic, extraoccular movements normal; neck is supple without thyromegally  Lynphnodes: Supraclavicular and inguinal nodes not enlarged  Abdomen: Soft, non-tender, no ascites, no organomegally, no masses, no hernias. Incision healing well, in tact, no drainage or erythema. Suprapubic site  clean. Pelvic: deferred Lower extremities: No edema or varicosities. Normal range of motion      Allergies  Allergen Reactions  . Onion Anaphylaxis and Other (See  Comments)  . Latex Rash  . Other Other (See Comments) and Hives    Pink dye Mouth sores  . Paroxetine Other (See Comments)    Hallucinations (intolerance)  . Paxil [Paroxetine Hcl] Other (See Comments)    hallucinations Hallucinations (intolerance)  . Prednisone Other (See Comments)    Patient states oral prednisone causes blisters in mouth. Patient states no reaction to injection of prednisone. Hives and blisters  . Propoxyphene Hives    Pink dye    Past Medical History:  Diagnosis Date  . Anxiety   . Chronic back pain   . Crohn's disease ([place])   . Depression   . Sciatica     Past Surgical History:  Procedure Laterality Date  . BUNIONECTOMY    . CHOLECYSTECTOMY    . MOUTH SURGERY      Current Outpatient Prescriptions  Medication Sig Dispense Refill  . acetaminophen (TYLENOL) 325 MG tablet Take 650 mg by mouth.    . docusate sodium (COLACE) 100 MG capsule TAKE 1 CAPSULE (100 MG TOTAL) BY MOUTH TWO (2) TIMES A DAY.  0  . enoxaparin (LOVENOX) 40 MG/0.4ML injection INJECT 0.4 ML (40 MG TOTAL) UNDER THE SKIN DAILY. FOR 26 DAYS  0  . ibuprofen (ADVIL,MOTRIN) 600 MG tablet Take 600 mg by mouth.    . Oxycodone HCl 10 MG TABS Take 1 tablet (10 mg total) by mouth every 4 (four) hours as needed. 50 tablet 0  . polyethylene glycol (MIRALAX / GLYCOLAX) packet TAKE 17 G BY MOUTH DAILY. FOR 3 DAYS  0  . senna (SENOKOT) 8.6 MG tablet Take by mouth.     No current facility-administered medications for this visit.     Social History   Social History  . Marital status: Single    Spouse name: N/A  . Number of children: N/A  . Years of education: N/A   Occupational History  . Not on file.   Social History Main Topics  . Smoking status: Current Every Day Smoker    Packs/day: 0.25    Years: 20.00    Types: Cigarettes  . Smokeless tobacco: Never Used  . Alcohol use No  . Drug use: No  . Sexual activity: Yes    Birth control/ protection: None   Other Topics Concern  . Not  on file   Social History Narrative  . No narrative on file    History reviewed. No pertinent family history.    Donaciano Eva, MD 05/20/2016, 2:26 PM

## 2016-05-22 LAB — URINE CULTURE

## 2016-05-23 ENCOUNTER — Telehealth: Payer: Self-pay

## 2016-05-23 DIAGNOSIS — T83511A Infection and inflammatory reaction due to indwelling urethral catheter, initial encounter: Principal | ICD-10-CM

## 2016-05-23 DIAGNOSIS — N390 Urinary tract infection, site not specified: Secondary | ICD-10-CM

## 2016-05-23 MED ORDER — NITROFURANTOIN MONOHYD MACRO 100 MG PO CAPS: 100.0000 mg | ORAL_CAPSULE | Freq: Two times a day (BID) | ORAL | 0 refills | Status: DC

## 2016-05-23 NOTE — Telephone Encounter (Signed)
-----   Message from Everitt Amber, MD sent at 05/23/2016  7:38 AM EDT ----- Barbaraann Share, would you mind phoning in a prescription for macrobid (nitrofurantoin 100mg  BID x 7 days) for Ms Burgener. We should get her back after she completes this for a repeat culture and test of cure. Thanks Terrence Dupont

## 2016-05-23 NOTE — Telephone Encounter (Signed)
[name] that she does have a UTI as noted below by Dr. Denman George. Sent prescription to [facility] in [place]. Katherine Mack is scheduled for a repeat urine culture on Monday 06-03-16 at 3:45 pm.

## 2016-06-03 ENCOUNTER — Other Ambulatory Visit: Payer: Medicaid Other

## 2016-06-03 DIAGNOSIS — T83511A Infection and inflammatory reaction due to indwelling urethral catheter, initial encounter: Principal | ICD-10-CM

## 2016-06-03 DIAGNOSIS — N390 Urinary tract infection, site not specified: Secondary | ICD-10-CM

## 2016-06-06 ENCOUNTER — Telehealth: Payer: Self-pay

## 2016-06-06 NOTE — Telephone Encounter (Signed)
Faxed results of urine cultures from suprapubic cathether done on  05-20-16 and 06-03-16 to [facility] for patient's  appointment with Dr. Aldean Ast this afternoon.

## 2016-06-07 ENCOUNTER — Telehealth: Payer: Self-pay

## 2016-06-07 NOTE — Telephone Encounter (Signed)
Gave Ms Appleby appointment for 06-28-16 at 1230 pm. Arrive at 215 pm.

## 2016-06-10 LAB — URINE CULTURE

## 2016-06-28 ENCOUNTER — Ambulatory Visit (HOSPITAL_BASED_OUTPATIENT_CLINIC_OR_DEPARTMENT_OTHER): Payer: Medicaid Other

## 2016-06-28 ENCOUNTER — Encounter: Payer: Self-pay | Admitting: Gynecology

## 2016-06-28 ENCOUNTER — Encounter: Payer: Self-pay | Admitting: Gynecologic Oncology

## 2016-06-28 ENCOUNTER — Ambulatory Visit: Payer: Medicaid Other | Attending: Gynecology | Admitting: Gynecology

## 2016-06-28 VITALS — BP 142/88 | HR 66 | Temp 98.4°F | Resp 18 | Wt 123.5 lb

## 2016-06-28 DIAGNOSIS — Z888 Allergy status to other drugs, medicaments and biological substances status: Secondary | ICD-10-CM | POA: Diagnosis not present

## 2016-06-28 DIAGNOSIS — Z87440 Personal history of urinary (tract) infections: Secondary | ICD-10-CM

## 2016-06-28 DIAGNOSIS — C539 Malignant neoplasm of cervix uteri, unspecified: Secondary | ICD-10-CM | POA: Diagnosis not present

## 2016-06-28 DIAGNOSIS — R300 Dysuria: Secondary | ICD-10-CM

## 2016-06-28 DIAGNOSIS — K5090 Crohn's disease, unspecified, without complications: Secondary | ICD-10-CM | POA: Insufficient documentation

## 2016-06-28 DIAGNOSIS — F17210 Nicotine dependence, cigarettes, uncomplicated: Secondary | ICD-10-CM | POA: Insufficient documentation

## 2016-06-28 DIAGNOSIS — Z90710 Acquired absence of both cervix and uterus: Secondary | ICD-10-CM

## 2016-06-28 DIAGNOSIS — R109 Unspecified abdominal pain: Secondary | ICD-10-CM

## 2016-06-28 DIAGNOSIS — N390 Urinary tract infection, site not specified: Secondary | ICD-10-CM

## 2016-06-28 LAB — URINALYSIS, MICROSCOPIC - CHCC
Bilirubin (Urine): NEGATIVE
Blood: NEGATIVE
Glucose: NEGATIVE mg/dL
KETONES: NEGATIVE mg/dL
Nitrite: NEGATIVE
Protein: NEGATIVE mg/dL
RBC / HPF: NEGATIVE (ref 0–2)
SPECIFIC GRAVITY, URINE: 1.005 (ref 1.003–1.035)
Urobilinogen, UR: 0.2 mg/dL (ref 0.2–1)
pH: 6 (ref 4.6–8.0)

## 2016-06-28 MED ORDER — SULFAMETHOXAZOLE-TRIMETHOPRIM 800-160 MG PO TABS: 1.0000 | ORAL_TABLET | Freq: Two times a day (BID) | ORAL | 0 refills | Status: DC

## 2016-06-28 NOTE — Patient Instructions (Signed)
Begin taking Bactrim DS twice daily for seven days.  Please let us know if your symptoms persist or worsen.  We will plan to see you in the office on June 15 or sooner if needed.  Please call for any needs or concerns.

## 2016-06-28 NOTE — Progress Notes (Signed)
Consult Note: Gyn-Onc   Katherine Mack 31 y.o. female  Chief Complaint  Patient presents with  . Cervical Cancer    Assessment and plan: Stage IB 1 squamous cell carcinoma cervix.  Status post radical abdominal hysterectomy and pelvic lymphadenectomy     05/14/2016. Good postoperative recovery. The patient may return to full levels of activity. Given her continuing symptoms that sound like a urinary tract infection we will begin Septra DS for 7 days. Urine culture is submitted. Patient return in the proximal and one month for continued follow-up. In the meantime she may return to full full-time work.     Interval history: Patient returns today for six-week postoperative check. Since having her catheter removed she's done well except for dysuria and some chills yesterday consistent with a urinary tract infection. Does recall she had Escherichia coli urinary tract infection several weeks ago treated with amoxicillin. She denies any true fevers. Otherwise her only complaint is a some persistent right lower quadrant pain.   HPI: 31 year old white female gravida 3 para 3 seen in consultation request of Dr. 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  The patient has a long-standing history of irregular menstrual periods. She reports that approximately or years ago she had a Pap smear that was abnormal and a LEEP procedure was recommended. However that time the patient did not have insurance and has not had any further follow-up until being seen at the [facility] on 03/28/2016. At that time she had a Pap smear consistent with squamous cell carcinoma the cervix and a pelvic examination suggesting a lesion on the cervix. Over the last several months she has had increasing pelvic pain. She is previously given a diagnosis of having endometriosis and Crohn's disease. Crohn's disease has not been actively treated the  present time. The patient does have chronic diarrhea. She has used Depo-Provera which she says hopes with pain to some degree. She also uses Aleve daily for pain. The patient underwent a radical hysterectomy and bilateral salpingectomy and bilateral pelvic lymphadenectomy on 05/14/2016. Final pathology showed negative margins and negative nodes. The patient had a copy to postoperative course.    Review of Systems:10 point review of systems is negative except as noted in interval history.   Vitals: Blood pressure (!) 142/88, pulse 66, temperature 98.4 F (36.9 C), resp. rate 18, weight 123 lb 8 oz (56 kg).  Physical Exam: General : The patient is a healthy woman in no acute distress.  HEENT: normocephalic, extraoccular movements normal; neck is supple without thyromegally  Lynphnodes: Supraclavicular and inguinal nodes not enlarged  Abdomen: Soft,, no ascites, no organomegally, no masses, no hernias , a Pfannenstiel incision is healing well. There is some pain to palpation in the right lower quadrant there is no rebound. Pelvic:  EGBUS: Normal female  Vagina: Normal, no lesions , cuff is healing well. Urethra and Bladder: Normal, non-tender  Cervix:  Surgically absent. Uterus surgically absent Bi-manual examination: Non-tender; no adenxal masses or nodularity, there is no parametrial involvement. Rectal: normal sphincter tone, no masses, no blood  Lower extremities: No edema or varicosities. Normal range of motion      Allergies  Allergen Reactions  . Onion Anaphylaxis and Other (See Comments)  . Latex Rash  . Other Other (See Comments) and Hives    Pink dye Mouth sores  . Paroxetine Other (See Comments)    Hallucinations (intolerance)  . Paxil [Paroxetine  Hcl] Other (See Comments)    hallucinations Hallucinations (intolerance)  . Prednisone Other (See Comments)    Patient states oral prednisone causes blisters in mouth. Patient states no reaction to injection of  prednisone. Hives and blisters  . Propoxyphene Hives    Pink dye    Past Medical History:  Diagnosis Date  . Anxiety   . Chronic back pain   . Crohn's disease ([place])   . Depression   . Sciatica     Past Surgical History:  Procedure Laterality Date  . BUNIONECTOMY    . CHOLECYSTECTOMY    . MOUTH SURGERY      Current Outpatient Prescriptions  Medication Sig Dispense Refill  . acetaminophen (TYLENOL) 325 MG tablet Take 650 mg by mouth.    Marland Kitchen ibuprofen (ADVIL,MOTRIN) 600 MG tablet Take 600 mg by mouth.     No current facility-administered medications for this visit.     Social History   Social History  . Marital status: Single    Spouse name: N/A  . Number of children: N/A  . Years of education: N/A   Occupational History  . Not on file.   Social History Main Topics  . Smoking status: Current Every Day Smoker    Packs/day: 0.25    Years: 20.00    Types: Cigarettes  . Smokeless tobacco: Never Used  . Alcohol use No  . Drug use: No  . Sexual activity: Yes    Birth control/ protection: None   Other Topics Concern  . Not on file   Social History Narrative  . No narrative on file    History reviewed. No pertinent family history.    Marti Sleigh, MD 06/28/2016, 1:32 PM

## 2016-06-29 LAB — URINE CULTURE: Organism ID, Bacteria: NO GROWTH

## 2016-07-01 ENCOUNTER — Telehealth: Payer: Self-pay

## 2016-07-01 NOTE — Telephone Encounter (Signed)
[name] that Dr. Denman George would like for her to come in for a Post Void residual as she may be retaining urine. Pt scheduled for 1130 am with Joylene John, NP tomorrow at 1130.

## 2016-07-01 NOTE — Telephone Encounter (Signed)
-----   Message from Dorothyann Gibbs, NP sent at 07/01/2016 10:19 AM EDT ----- No growth on culture.  Please let her know and see how her symptoms are.  Thank you! ----- Message ----- From: Interface, Lab In [facility] Sent: 06/28/2016   1:49 PM To: Dorothyann Gibbs, NP

## 2016-07-01 NOTE — Telephone Encounter (Signed)
LM with results of Urine culture as noted below by Joylene John, NP. Requested that she call back to report if any current  urinary symptoms.

## 2016-07-02 ENCOUNTER — Ambulatory Visit (HOSPITAL_BASED_OUTPATIENT_CLINIC_OR_DEPARTMENT_OTHER): Payer: Medicaid Other

## 2016-07-02 ENCOUNTER — Telehealth: Payer: Self-pay | Admitting: Gynecologic Oncology

## 2016-07-02 ENCOUNTER — Other Ambulatory Visit: Payer: Self-pay | Admitting: Gynecologic Oncology

## 2016-07-02 ENCOUNTER — Ambulatory Visit (HOSPITAL_COMMUNITY)
Admission: RE | Admit: 2016-07-02 | Discharge: 2016-07-02 | Disposition: A | Discharge status: Home / Self Care | Payer: Medicaid Other | Source: Ambulatory Visit | Attending: Gynecologic Oncology | Admitting: Gynecologic Oncology

## 2016-07-02 ENCOUNTER — Encounter (HOSPITAL_COMMUNITY): Payer: Self-pay

## 2016-07-02 ENCOUNTER — Ambulatory Visit: Payer: Medicaid Other | Attending: Gynecologic Oncology | Admitting: Gynecologic Oncology

## 2016-07-02 VITALS — BP 118/86 | HR 84 | Temp 98.0°F | Resp 18

## 2016-07-02 DIAGNOSIS — M545 Low back pain: Secondary | ICD-10-CM | POA: Diagnosis not present

## 2016-07-02 DIAGNOSIS — R6883 Chills (without fever): Secondary | ICD-10-CM | POA: Insufficient documentation

## 2016-07-02 DIAGNOSIS — Z90710 Acquired absence of both cervix and uterus: Secondary | ICD-10-CM | POA: Diagnosis not present

## 2016-07-02 DIAGNOSIS — R350 Frequency of micturition: Secondary | ICD-10-CM

## 2016-07-02 DIAGNOSIS — R34 Anuria and oliguria: Secondary | ICD-10-CM

## 2016-07-02 DIAGNOSIS — R1031 Right lower quadrant pain: Secondary | ICD-10-CM | POA: Insufficient documentation

## 2016-07-02 DIAGNOSIS — R39198 Other difficulties with micturition: Secondary | ICD-10-CM | POA: Diagnosis not present

## 2016-07-02 LAB — BASIC METABOLIC PANEL
Anion Gap: 10 mEq/L (ref 3–11)
BUN: 18.6 mg/dL (ref 7.0–26.0)
CO2: 22 mEq/L (ref 22–29)
CREATININE: 0.9 mg/dL (ref 0.6–1.1)
Calcium: 10 mg/dL (ref 8.4–10.4)
Chloride: 107 mEq/L (ref 98–109)
EGFR: >90 mL/min/{1.73_m2} (ref >90)
Glucose: 89 mg/dl (ref 70–140)
Potassium: 4.9 mEq/L (ref 3.5–5.1)
Sodium: 139 mEq/L (ref 136–145)

## 2016-07-02 LAB — CBC WITH DIFFERENTIAL/PLATELET
BASO%: 0.2 % (ref 0.0–2.0)
Basophils Absolute: 0 10*3/uL (ref 0.0–0.1)
EOS%: 0.7 % (ref 0.0–7.0)
Eosinophils Absolute: 0.1 10*3/uL (ref 0.0–0.5)
HCT: 40.9 % (ref 34.8–46.6)
HEMOGLOBIN: 14 g/dL (ref 11.6–15.9)
LYMPH%: 36.8 % (ref 14.0–49.7)
MCH: 31.2 pg (ref 25.1–34.0)
MCHC: 34.2 g/dL (ref 31.5–36.0)
MCV: 91.4 fL (ref 79.5–101.0)
MONO#: 0.4 10*3/uL (ref 0.1–0.9)
MONO%: 5.7 % (ref 0.0–14.0)
NEUT#: 4 10*3/uL (ref 1.5–6.5)
NEUT%: 56.6 % (ref 38.4–76.8)
Platelets: 323 10*3/uL (ref 145–400)
RBC: 4.48 10*6/uL (ref 3.70–5.45)
RDW: 14.3 % (ref 11.2–14.5)
WBC: 7.1 10*3/uL (ref 3.9–10.3)
lymph#: 2.6 10*3/uL (ref 0.9–3.3)

## 2016-07-02 MED ORDER — PHENAZOPYRIDINE HCL 200 MG PO TABS: 200.0000 mg | ORAL_TABLET | Freq: Three times a day (TID) | ORAL | 0 refills | Status: DC | PRN

## 2016-07-02 MED ORDER — IOPAMIDOL (ISOVUE-300) INJECTION 61%
INTRAVENOUS | Status: AC
  Administered 2016-07-02: 100 mL
  Filled 2016-07-02: qty 100

## 2016-07-02 MED ORDER — PHENAZOPYRIDINE HCL 95 MG PO TABS: 95.0000 mg | ORAL_TABLET | Freq: Three times a day (TID) | ORAL | 0 refills | Status: DC | PRN

## 2016-07-02 NOTE — Telephone Encounter (Signed)
Called patient and discussed CT scan results.  Per Dr. Fermin Schwab, patient is to begin taking pyridium to see if that alleviates some of her symptoms.  She is to call tomorrow with an update.

## 2016-07-02 NOTE — Addendum Note (Signed)
Addended by: Joylene John D on: 07/02/2016 04:15 PM   Modules accepted: Orders

## 2016-07-02 NOTE — Patient Instructions (Signed)
Plan to have a CT scan of the abdomen and pelvis today to evaluate your issues with urination.  We will let you know the results.  We will also check lab work today.    Your CT scan is today at [facility].  Nothing to eat or drink starting now.  Arrive at 2:45pm for a 3 pm appointment.  Once you have finished with your scan, you can come back over to the [facility] while we await the results.

## 2016-07-02 NOTE — Progress Notes (Signed)
Follow Up Note: Gyn-Onc  Katherine Mack 31 y.o. female  CC:  Chief Complaint  Patient presents with  . Decreased Urinary Output    Follow up  . Chills  . Urinary Frequency    HPI:  Katherine Mack is a 31 year old female, gravida 3 para 3, initially seen in consultation request of Dr. 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  The patient reported a long history of irregular menstrual periods. She reports that approximately or years ago she had a Pap smear that was abnormal and a LEEP procedure was recommended. However that time the patient did not have insurance and has not had any further follow-up until being seen at the [facility] on 03/28/2016. At that time she had a Pap smear consistent with squamous cell carcinoma the cervix and a pelvic examination suggesting a lesion on the cervix.  Biopsy was obtained in the office on April 26, 2016 by Dr. Fermin Schwab resulting: [place].  HIGH GRADE DYSPLASIA INVOLVES UNDERLYING ENDOCERVICAL GLANDS.  ASSOCIATED ACUTE AND CHRONIC CERVICITIS.    The patient underwent a radical hysterectomy and bilateral salpingectomy and bilateral pelvic lymphadenectomy on 05/14/2016. Final pathology showed negative margins and negative nodes with no invasive carcinoma identified.  Her suprapubic catheter was removed on 06/06/16 per pt.    Interval History:  Patient presents today with her family in the waiting room for evaluation of decreased urinary output, urinary frequency, continued intermittent chills, right lower quadrant pain, and lower back discomfort.  She states she has been experiencing the following symptoms since April 30 but states they have become more frequent and intense.  She reports starting the Septra DS on Friday evening but states she has only been voiding a few drops at a time with  having to go to the restroom every five minutes.  She reports darker urine this am.  Denies hematuria.  She reports drinking 8-9 16 oz cups of water a day.  She continues to reports intermittent chills but denies having a fever or checking her temperature.  She also continue to report persistent right lower quadrant pain above her incision and bilateral lower back pain.  She denies aggravating or relieving factors.  Bowels functioning.  No other concerns voiced.    Review of Systems Constitutional: Feels well except for significant urinary frequency, RLQ pain, and lower back pain.  Positive for chills.  Denies documented fever.  Tolerating diet.  Cardiovascular: No chest pain, shortness of breath, or edema.  Pulmonary: No cough or wheeze.  Gastrointestinal: No nausea, vomiting, or diarrhea. No bright red blood per rectum or change in bowel movement.  Genitourinary: Positive for frequency.  No urgency.  Mild dysuria. No vaginal bleeding or discharge.  Musculoskeletal: RLQ pain and lower back discomfort. Neurologic: No weakness, numbness, or change in gait.  Psychology: No depression, anxiety, or insomnia.  Current Meds:  Outpatient Encounter Prescriptions as of 07/02/2016  Medication Sig  . acetaminophen (TYLENOL) 325 MG tablet Take 650 mg by mouth.  Marland Kitchen ibuprofen (ADVIL,MOTRIN) 600 MG tablet Take 600 mg by mouth.  . sulfamethoxazole-trimethoprim (BACTRIM DS,SEPTRA DS) 800-160 MG tablet Take 1 tablet by mouth 2 (two) times daily.   No facility-administered encounter medications on file as of 07/02/2016.     Allergy:  Allergies  Allergen Reactions  . Onion Anaphylaxis and Other (See Comments)  . Latex Rash  .  Other Other (See Comments) and Hives    Pink dye Mouth sores  . Paroxetine Other (See Comments)    Hallucinations (intolerance)  . Paxil [Paroxetine Hcl] Other (See Comments)    hallucinations Hallucinations (intolerance)  . Prednisone Other (See Comments)    Patient states oral  prednisone causes blisters in mouth. Patient states no reaction to injection of prednisone. Hives and blisters  . Propoxyphene Hives    Pink dye    Social Hx:   Social History   Social History  . Marital status: Single    Spouse name: N/A  . Number of children: N/A  . Years of education: N/A   Occupational History  . Not on file.   Social History Main Topics  . Smoking status: Current Every Day Smoker    Packs/day: 0.25    Years: 20.00    Types: Cigarettes  . Smokeless tobacco: Never Used  . Alcohol use No  . Drug use: No  . Sexual activity: Yes    Birth control/ protection: None   Other Topics Concern  . Not on file   Social History Narrative  . No narrative on file    Past Surgical Hx:  Past Surgical History:  Procedure Laterality Date  . BUNIONECTOMY    . CHOLECYSTECTOMY    . MOUTH SURGERY      Past Medical Hx:  Past Medical History:  Diagnosis Date  . Anxiety   . Chronic back pain   . Crohn's disease ([place])   . Depression   . Sciatica     Family Hx: No family history on file.  Vitals:  Blood pressure 118/86, pulse 84, temperature 98 F (36.7 C), temperature source Oral, resp. rate 18.  Physical Exam:  General: Well developed, well nourished female in no acute distress. Alert and oriented x 3.  Skin: No rashes or lesions present. Back: Mild CVA tenderness reported along with tenderness across the lower back.  Abdomen: Abdomen soft, non-tender and non-obese. Abdomen palpated with no evidence of bladder distension.  Tenderness with palpation of the right lower quadrant.  Incision healing.  Active bowel sounds in all quadrants.  Genitourinary:    Vulva/vagina: Normal external female genitalia. No lesions.    Urethra: No lesions or masses.  Patient had voided only a few drops of urine prior to in and out cath.  In and out cath performed without difficulty.  No obstruction met.  3 cc of dark yellow urine out.  Patient instructed on how to self cath  during procedure as well. Extremities: No bilateral cyanosis, edema, or clubbing.   Assessment/Plan:  31 year old s/p radical hysterectomy on 05/14/16 with Dr. Fermin Schwab at [facility].  Persistent symptoms of urinary frequency, decreased urinary output, chills, and RLQ pain/lower back pain.  Minimal output on in and out cath.  Situation discussed with Dr. Denman George and Dr. Fermin Schwab.  3 cc of urine sent for culture if adequate amount present.  CBC with diff and Bmet now to assess white count and kidney function.  Plan for CT scan of the abdomen and pelvis STAT to rule out ureteral etiology including injury, pelvic fluid collection, hydronephrosis.  No oral contrast per Dr. Denman George.  Will follow up on results and instruct patient accordingly.  Update: BMET    Component Value Date/Time   NA 139 07/02/2016 1202   K 4.9 07/02/2016 1202   CL 102 01/15/2013 1202   CO2 22 07/02/2016 1202   GLUCOSE 89 07/02/2016 1202   BUN 18.6  07/02/2016 1202   CREATININE 0.9 07/02/2016 1202   CALCIUM 10.0 07/02/2016 1202   GFRNONAA >90 01/15/2013 1202   GFRAA >90 01/15/2013 1202   CBC    Component Value Date/Time   WBC 7.1 07/02/2016 1202   WBC 6.1 01/15/2013 1202   RBC 4.48 07/02/2016 1202   RBC 3.74 (L) 01/15/2013 1202   HGB 14.0 07/02/2016 1202   HCT 40.9 07/02/2016 1202   PLT 323 07/02/2016 1202   MCV 91.4 07/02/2016 1202   MCH 31.2 07/02/2016 1202   MCH 32.6 01/15/2013 1202   MCHC 34.2 07/02/2016 1202   MCHC 35.1 01/15/2013 1202   RDW 14.3 07/02/2016 1202   LYMPHSABS 2.6 07/02/2016 1202   MONOABS 0.4 07/02/2016 1202   EOSABS 0.1 07/02/2016 1202   BASOSABS 0.0 07/02/2016 1202   Labs reviewed.  BUN and creatinine within normal limits but trending upward when compared with previous lab draws.  WBC count 7.1.   Dovid Bartko DEAL, NP 07/02/2016, 1:46 PM

## 2016-07-03 LAB — URINE CULTURE: Organism ID, Bacteria: NO GROWTH

## 2016-07-09 ENCOUNTER — Telehealth: Payer: Self-pay | Admitting: Gynecologic Oncology

## 2016-07-09 DIAGNOSIS — R350 Frequency of micturition: Secondary | ICD-10-CM

## 2016-07-09 DIAGNOSIS — R3989 Other symptoms and signs involving the genitourinary system: Secondary | ICD-10-CM

## 2016-07-09 MED ORDER — PHENAZOPYRIDINE HCL 200 MG PO TABS: 200.0000 mg | ORAL_TABLET | Freq: Three times a day (TID) | ORAL | 0 refills | Status: DC | PRN

## 2016-07-09 NOTE — Telephone Encounter (Signed)
Called to check on patient's current status.  Patient stating she does not have to go as much and she is voiding larger amounts.  States the burning has eased up and the pyridium pills have helped significantly.  Requesting a refill.  Refill given.  Reportable signs and symptoms reviewed.  Advised to call for any needs

## 2016-07-16 ENCOUNTER — Telehealth: Payer: Self-pay

## 2016-07-16 NOTE — Telephone Encounter (Signed)
Katherine Mack called to see if she needed to go to local hospital.  She has been running a temp of 100.0 and has purulent drainage from her incision. Katherine Mack has no transportation to come to gyn clinic.  She can have some one bring her to [facility] in [place].  Suggested she go there to have incision and temperature evaluated. Katherine Mack verbalized understanding.

## 2016-07-25 ENCOUNTER — Telehealth: Payer: Self-pay

## 2016-07-25 NOTE — Telephone Encounter (Signed)
Pt called to cancel her appointment tomorrow with Dr. Aldean Ast as she needs to go out of town for a family emergency for a few weeks. Attempted to call patient to confirm receipt of message but unalbe to contact her and her call phone number in EMR does not have voice mail set up. Home number does not connect. Pt will call back to reschedule.

## 2016-07-26 ENCOUNTER — Ambulatory Visit: Payer: Medicaid Other | Admitting: Gynecology

## 2016-11-21 ENCOUNTER — Telehealth: Payer: Self-pay

## 2016-11-21 NOTE — Telephone Encounter (Addendum)
Spoke with Katherine Mack and she stated that her pain in abdomen was a 510. She has not had any  vaginal bleeding today. Told her that a CT A/P will be ordered to evaluate pain with Hx of cervical cancer to evaluate for recurrence.

## 2016-11-22 NOTE — Telephone Encounter (Signed)
Spoke with Katherine Mack and gave her an appointment for a CT A/P for 11-26-16 at [facility] at [phone]. She can pick up her contrast and directions at [facility] prior to 11-26-16. Joylene John, NP will see her [phone] on 11-26-16 after CT scan to exam her and review results of CT scan.

## 2016-11-26 ENCOUNTER — Other Ambulatory Visit (HOSPITAL_BASED_OUTPATIENT_CLINIC_OR_DEPARTMENT_OTHER): Payer: Medicaid Other

## 2016-11-26 ENCOUNTER — Ambulatory Visit: Payer: Medicaid Other | Attending: Gynecologic Oncology | Admitting: Gynecologic Oncology

## 2016-11-26 ENCOUNTER — Ambulatory Visit (HOSPITAL_COMMUNITY)
Admission: RE | Admit: 2016-11-26 | Discharge: 2016-11-26 | Disposition: A | Discharge status: Home / Self Care | Payer: Medicaid Other | Source: Ambulatory Visit | Attending: Gynecologic Oncology | Admitting: Gynecologic Oncology

## 2016-11-26 ENCOUNTER — Other Ambulatory Visit (HOSPITAL_COMMUNITY)
Admission: RE | Admit: 2016-11-26 | Discharge: 2016-11-26 | Disposition: A | Discharge status: Home / Self Care | Payer: Medicaid Other | Source: Ambulatory Visit | Attending: Gynecologic Oncology | Admitting: Gynecologic Oncology

## 2016-11-26 ENCOUNTER — Encounter: Payer: Self-pay | Admitting: Gynecologic Oncology

## 2016-11-26 VITALS — BP 127/95 | HR 78 | Temp 98.2°F | Resp 20 | Wt 125.8 lb

## 2016-11-26 DIAGNOSIS — B379 Candidiasis, unspecified: Secondary | ICD-10-CM | POA: Insufficient documentation

## 2016-11-26 DIAGNOSIS — R1031 Right lower quadrant pain: Secondary | ICD-10-CM

## 2016-11-26 DIAGNOSIS — N9489 Other specified conditions associated with female genital organs and menstrual cycle: Secondary | ICD-10-CM | POA: Insufficient documentation

## 2016-11-26 DIAGNOSIS — Z9049 Acquired absence of other specified parts of digestive tract: Secondary | ICD-10-CM | POA: Insufficient documentation

## 2016-11-26 DIAGNOSIS — Z888 Allergy status to other drugs, medicaments and biological substances status: Secondary | ICD-10-CM | POA: Insufficient documentation

## 2016-11-26 DIAGNOSIS — B373 Candidiasis of vulva and vagina: Secondary | ICD-10-CM | POA: Diagnosis not present

## 2016-11-26 DIAGNOSIS — Z8541 Personal history of malignant neoplasm of cervix uteri: Secondary | ICD-10-CM | POA: Diagnosis not present

## 2016-11-26 DIAGNOSIS — Z79891 Long term (current) use of opiate analgesic: Secondary | ICD-10-CM | POA: Diagnosis not present

## 2016-11-26 DIAGNOSIS — R109 Unspecified abdominal pain: Secondary | ICD-10-CM | POA: Insufficient documentation

## 2016-11-26 DIAGNOSIS — F17210 Nicotine dependence, cigarettes, uncomplicated: Secondary | ICD-10-CM | POA: Insufficient documentation

## 2016-11-26 DIAGNOSIS — K5901 Slow transit constipation: Secondary | ICD-10-CM

## 2016-11-26 DIAGNOSIS — R300 Dysuria: Secondary | ICD-10-CM

## 2016-11-26 DIAGNOSIS — C539 Malignant neoplasm of cervix uteri, unspecified: Secondary | ICD-10-CM

## 2016-11-26 DIAGNOSIS — B3731 Acute candidiasis of vulva and vagina: Secondary | ICD-10-CM

## 2016-11-26 DIAGNOSIS — Z9889 Other specified postprocedural states: Secondary | ICD-10-CM | POA: Diagnosis not present

## 2016-11-26 DIAGNOSIS — K5900 Constipation, unspecified: Secondary | ICD-10-CM

## 2016-11-26 DIAGNOSIS — Z79899 Other long term (current) drug therapy: Secondary | ICD-10-CM | POA: Insufficient documentation

## 2016-11-26 LAB — URINALYSIS, MICROSCOPIC - CHCC
BILIRUBIN (URINE): NEGATIVE
Blood: NEGATIVE
GLUCOSE UR CHCC: NEGATIVE mg/dL
KETONES: NEGATIVE mg/dL
Leukocyte Esterase: NEGATIVE
Nitrite: NEGATIVE
Protein: NEGATIVE mg/dL
RBC / HPF: NEGATIVE (ref 0–2)
Specific Gravity, Urine: 1.005 (ref 1.003–1.035)
Urobilinogen, UR: 0.2 mg/dL (ref 0.2–1)
WBC, UA: NEGATIVE (ref 0–?)
pH: 6 (ref 4.6–8.0)

## 2016-11-26 MED ORDER — FLUCONAZOLE 100 MG PO TABS: 100.0000 mg | ORAL_TABLET | Freq: Every day | ORAL | 0 refills | Status: DC

## 2016-11-26 MED ORDER — IOPAMIDOL (ISOVUE-300) INJECTION 61%
INTRAVENOUS | Status: AC
  Administered 2016-11-26: 100 mL
  Filled 2016-11-26: qty 100

## 2016-11-26 MED ORDER — TRAMADOL HCL 50 MG PO TABS: 50.0000 mg | ORAL_TABLET | Freq: Four times a day (QID) | ORAL | 0 refills | Status: DC | PRN

## 2016-11-26 MED ORDER — MAGNESIUM CITRATE PO SOLN: 1.0000 | Freq: Once | ORAL | 1 refills | Status: AC

## 2016-11-26 MED ORDER — MICONAZOLE NITRATE 2 % EX CREA: 1.0000 "application " | TOPICAL_CREAM | Freq: Two times a day (BID) | CUTANEOUS | 0 refills | Status: DC

## 2016-11-26 NOTE — Progress Notes (Signed)
Follow Up Note: Gyn-Onc  Katherine Mack 31 y.o. female  CC:  Chief Complaint  Patient presents with  . Malignant neoplasm of cervix, unspecified site ([place])  . Abdominal Pain    HPI: Katherine Mack is a 31 year old female, gravida 3 para 3, initially seen in consultation request of Dr. 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  The patient reported a long history of irregular menstrual periods. She reports that approximately or years ago she had a Pap smear that was abnormal and a LEEP procedure was recommended. However that time the patient did not have insurance and has not had any further follow-up until being seen at the [facility] on 03/28/2016. At that time she had a Pap smear consistent with squamous cell carcinoma the cervix and a pelvic examination suggesting a lesion on the cervix.  Biopsy was obtained in the office on April 26, 2016 by Dr. Fermin Mack resulting: Katherine Mack.  HIGH GRADE DYSPLASIA INVOLVES UNDERLYING ENDOCERVICAL GLANDS.  ASSOCIATED ACUTE AND CHRONIC CERVICITIS.    The patient underwent a radical hysterectomy and bilateral salpingectomy and bilateral pelvic lymphadenectomy on 05/14/2016. Final pathology showed negative margins and negative nodes with no invasive carcinoma identified.  Her suprapubic catheter was removed on 06/06/16 per pt.    Interval History:  She presents today alone after CT imaging for evaluation of right sided abdominal pain.  She states the pain began on Wednesday and has been constant.  She has been taking tylenol and ibuprofen with little relief.  No aggravating or alleviating factors.  She also states she noted vaginal bleeding on Wednesday but has not had any since.  She went to the ED in [place] and had a urine sample evaluated along with a wet prep.  No scan was performed during that  visit.  She states she was worried with the bleeding and her history of cervical cancer.  Due to vaginal bleeding and persistent abdominal pain, a CT scan was ordered to rule out recurrence prior to her visit today.  She has been tolerating her diet with no nausea or emesis. No early satiety or change in weight reported.  She states she has been taking stool softeners and is unable to tolerate Miralax. She states she has crohn's and normally has diarrhea but she seems to be having harder stools lately.  Last BM was this am.  Denies dysuria, frequency, or urgency.  She states she has not seen any blood in her stool for a long time and she has seen a gastroenterologist in [place] for her crohn's but it has been awhile ago.  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  She denies injury or strain that preceded the onset of her pain.  She reports symptoms of yeast on the vulva which she used a travel size of monistat for.  Asking for a work note for today.  No other concerns voiced.      Review of Systems  Constitutional: Feels pain.  No fever, positive for feeling cold, no early satiety, no change in weight.  Cardiovascular: No chest pain, shortness of breath, or edema.  Pulmonary: No cough or wheeze.  Gastrointestinal: No nausea, vomiting, or diarrhea. No bright red blood per rectum.  Positive for change in bowel movement.  Genitourinary:  No frequency, urgency, or dysuria. Vaginal bleeding x 1 episode  Musculoskeletal: No myalgia or joint pain. Neurologic: No weakness, numbness, or change in gait.  Psychology: No depression, anxiety, or insomnia.  Current Meds:  Outpatient Encounter Prescriptions as of 11/26/2016  Medication Sig  . acetaminophen (TYLENOL) 325 MG tablet Take 650 mg by mouth.  . fluconazole (DIFLUCAN) 100 MG tablet Take 1 tablet (100 mg total) by mouth daily.  Marland Kitchen ibuprofen (ADVIL,MOTRIN) 600 MG tablet Take 600 mg by mouth.  .  magnesium citrate SOLN Take 296 mLs (1 Bottle total) by mouth once.  . miconazole (MICOTIN) 2 % cream Apply 1 application topically 2 (two) times daily. To the vulva for itching  . traMADol (ULTRAM) 50 MG tablet Take 1 tablet (50 mg total) by mouth every 6 (six) hours as needed. Do not take and drive.  Use if tylenol/ibuprofen ineffective  . [DISCONTINUED] phenazopyridine (PYRIDIUM) 200 MG tablet Take 1 tablet (200 mg total) by mouth 3 (three) times daily as needed for pain.  . [DISCONTINUED] sulfamethoxazole-trimethoprim (BACTRIM DS,SEPTRA DS) 800-160 MG tablet Take 1 tablet by mouth 2 (two) times daily.   No facility-administered encounter medications on file as of 11/26/2016.     Allergy:  Allergies  Allergen Reactions  . Onion Anaphylaxis and Other (See Comments)  . Latex Rash  . Other Other (See Comments) and Hives    Pink dye Mouth sores  . Paroxetine Other (See Comments)    Hallucinations (intolerance)  . Paxil [Paroxetine Hcl] Other (See Comments)    hallucinations Hallucinations (intolerance)  . Prednisone Other (See Comments)    Patient states oral prednisone causes blisters in mouth. Patient states no reaction to injection of prednisone. Hives and blisters  . Propoxyphene Hives    Pink dye    Social Hx:   Social History   Social History  . Marital status: Single    Spouse name: N/A  . Number of children: N/A  . Years of education: N/A   Occupational History  . Not on file.   Social History Main Topics  . Smoking status: Current Every Day Smoker    Packs/day: 0.25    Years: 20.00    Types: Cigarettes  . Smokeless tobacco: Never Used  . Alcohol use No  . Drug use: No  . Sexual activity: Yes    Birth control/ protection: None   Other Topics Concern  . Not on file   Social History Narrative  . No narrative on file    Past Surgical Hx:  Past Surgical History:  Procedure Laterality Date  . BUNIONECTOMY    . CHOLECYSTECTOMY    . MOUTH SURGERY       Past Medical Hx:  Past Medical History:  Diagnosis Date  . Anxiety   . Cervical cancer ([place])    Dx: 04-2016  . Chronic back pain   . Crohn's disease ([place])   . Depression   . Sciatica    CT 11/26/16: IMPRESSION: Mild increase in size of 3.9 cm simple appearing cyst in the upper left pelvis adjacent to surgical clips, most likely representing a postop lymphocele. Continued attention recommended on follow-up imaging.  No evidence of recurrent or metastatic carcinoma within the abdomen or pelvis.  Moderate to large stool burden noted; recommend clinical correlation for possible constipation.  Family Hx: History reviewed. No pertinent family history.  Vitals:  Blood pressure (!) 127/95, pulse 78, temperature 98.2 F (36.8 C), temperature source Oral, resp. rate 20, weight 125 lb 12.8  oz (57.1 kg), last menstrual period 05/21/2014, SpO2 97 %.  Physical Exam:  General: Well developed, well nourished female in no acute distress. Alert and oriented x 3.  Neck: Supple without any enlargements.  Lymph node survey: No cervical, supraclavicular, or inguinal adenopathy.  Cardiovascular: Regular rate and rhythm. S1 and S2 normal.  Lungs: Clear to auscultation bilaterally. No wheezes/crackles/rhonchi noted.  Skin: No rashes or lesions present. Back: CVA tenderness present bilaterally, more on the right.  Abdomen: Abdomen soft, tender on palpation of right lower quadrant and non-obese. Active bowel sounds in all quadrants. No evidence of a fluid wave or abdominal masses.  Genitourinary:    Vulva/vagina: Normal external female genitalia. No lesions.    Urethra: No lesions or masses    Vagina: Moderate candidiasis in the vagina.  ThinPrep pap obtained without difficulty.  No palpable masses. No vaginal bleeding or drainage noted.  Rectal: Good tone, no masses, no cul de sac nodularity.  Extremities: No bilateral cyanosis, edema, or clubbing.   Assessment/Plan: 31 year old female with  Stage IB 1 squamous cell carcinoma cervix without evidence of recurrence on examination.  CT scan today for evaluation of abdominal pain without evidence of recurrence or finding to correlate with abdominal pain.  Moderate candidiasis noted in the vagina.  Plan to obtain urine for analysis and culture due to increased CVA tenderness, abd pain.  Diflucan prescribed along with miconazole cream for external use.  Bowel regimen discussed with magnesium citrate recommended and CT images discussed with patient.  We will contact her with the results of her pap smear from today and with her urine results. Tramadol given for severe abdominal pain and advised her to only take for severe pain if tylenol or ibuprofen are ineffective.  Advised not to take and drive.  She is to call our office if her symptoms worsen or persist at the end of this week or beginning of next.  If her pain persists, consider referral back to her gastroenterologist in [place].  Reportable signs and symptoms reviewed.  She is advised to call for any needs.     Gareld Obrecht DEAL, NP 11/26/2016, 10:59 AM

## 2016-11-26 NOTE — Patient Instructions (Signed)
Plan on taking one diflucan tablet today and tomorrow.  Call us if your symptoms persist by Friday or Monday.  Also take one bottle of magnesium citrate to clear out your bowels.  We will call you with the results of your pap smear from today.  We will call you with the results of your pap from today.  If your pain persists, we will get you back to your gastroenterologist.  Please call for any questions or concerns at [phone].  We will also send in cream to use on the outside of your vulva for itching symptoms.

## 2016-11-27 LAB — URINE CULTURE: ORGANISM ID, BACTERIA: NO GROWTH

## 2016-11-28 ENCOUNTER — Telehealth: Payer: Self-pay | Admitting: Gynecologic Oncology

## 2016-11-28 LAB — CYTOLOGY - PAP: DIAGNOSIS: NEGATIVE

## 2016-11-28 NOTE — Telephone Encounter (Signed)
Left message for patient about pap smear being normal.  Advised to call for any needs.

## 2016-11-28 NOTE — Telephone Encounter (Signed)
Left message for patient with urine culture results.  Advised her to call the office if her abd pain is still present or has worsened.  Advised to call for any needs.

## 2017-07-15 ENCOUNTER — Other Ambulatory Visit: Payer: Self-pay

## 2017-07-15 ENCOUNTER — Encounter (HOSPITAL_COMMUNITY): Payer: Self-pay | Admitting: Emergency Medicine

## 2017-07-15 ENCOUNTER — Emergency Department (HOSPITAL_COMMUNITY)
Admission: EM | Admit: 2017-07-15 | Discharge: 2017-07-15 | Disposition: A | Discharge status: Home / Self Care | Payer: Medicaid Other | Attending: Emergency Medicine | Admitting: Emergency Medicine

## 2017-07-15 DIAGNOSIS — F17210 Nicotine dependence, cigarettes, uncomplicated: Secondary | ICD-10-CM | POA: Insufficient documentation

## 2017-07-15 DIAGNOSIS — G43909 Migraine, unspecified, not intractable, without status migrainosus: Secondary | ICD-10-CM | POA: Insufficient documentation

## 2017-07-15 DIAGNOSIS — Z8541 Personal history of malignant neoplasm of cervix uteri: Secondary | ICD-10-CM | POA: Diagnosis not present

## 2017-07-15 DIAGNOSIS — R51 Headache: Secondary | ICD-10-CM | POA: Diagnosis present

## 2017-07-15 DIAGNOSIS — Z79899 Other long term (current) drug therapy: Secondary | ICD-10-CM | POA: Insufficient documentation

## 2017-07-15 DIAGNOSIS — Z91040 Latex allergy status: Secondary | ICD-10-CM | POA: Insufficient documentation

## 2017-07-15 DIAGNOSIS — M25511 Pain in right shoulder: Secondary | ICD-10-CM | POA: Insufficient documentation

## 2017-07-15 MED ORDER — METOCLOPRAMIDE HCL 5 MG/ML IJ SOLN
10.0000 mg | Freq: Once | INTRAMUSCULAR | Status: AC
  Administered 2017-07-15: 10 mg via INTRAVENOUS
  Filled 2017-07-15: qty 2

## 2017-07-15 MED ORDER — SODIUM CHLORIDE 0.9 % IV BOLUS
1000.0000 mL | Freq: Once | INTRAVENOUS | Status: AC
  Administered 2017-07-15: 1000 mL via INTRAVENOUS

## 2017-07-15 MED ORDER — KETOROLAC TROMETHAMINE 30 MG/ML IJ SOLN
30.0000 mg | Freq: Once | INTRAMUSCULAR | Status: AC
  Administered 2017-07-15: 30 mg via INTRAVENOUS
  Filled 2017-07-15: qty 1

## 2017-07-15 NOTE — Discharge Instructions (Addendum)
Your evaluated in the emergency department for a migraine headache.  You improved with some medication and fluids and felt well enough to go home.  He should go home and get some rest and stay well-hydrated.  Recommend ibuprofen if your headache recurs.  Please follow-up with your doctor and return if any worsening symptoms.

## 2017-07-15 NOTE — ED Notes (Signed)
Patient ambulatory to bathroom.

## 2017-07-15 NOTE — ED Provider Notes (Signed)
[facility] EMERGENCY DEPARTMENT Provider Note   CSN: [id number] Arrival date & time: 07/15/17  1129     History   Chief Complaint Chief Complaint  Patient presents with  . Migraine    HPI Katherine Mack is a 32 y.o. female.  She is present in the emergency department today with a migraine headache.  She is complaining of headache yesterday that somewhat resolved with Tylenol but acutely worsened overnight associated with some photophobia and 4 episodes of nonbloody nonbilious vomiting.  Headache is on the left side throbbing in nature.  She is had headaches like this in the past.  She denies any obvious trigger for this and denies any recent trauma.  She states she is got some pain in her right trapezius that she thinks she strained with vomiting.  There is been no fever chills no abdominal pain no chest pain no cough no shortness of breath no urinary symptoms.  She is had a hysterectomy and denies any chance of pregnancy.  She tried some Tylenol with any relief and vomited up.  She states when she comes to the hospital for this they usually give her a shot and she can go home and get some rest.  The history is provided by the patient.  Migraine  This is a recurrent problem. The current episode started 6 to 12 hours ago. The problem occurs rarely. The problem has not changed since onset.Associated symptoms include headaches. Pertinent negatives include no chest pain, no abdominal pain and no shortness of breath. Nothing aggravates the symptoms. Nothing relieves the symptoms. She has tried acetaminophen for the symptoms. The treatment provided no relief.    Past Medical History:  Diagnosis Date  . Anxiety   . Cervical cancer ([place])    Dx: 04-2016  . Chronic back pain   . Crohn's disease ([place])   . Depression   . Sciatica     Patient Active Problem List   Diagnosis Date Noted  . 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 ABDOMINAL  HYSTERECTOMY    . BUNIONECTOMY    . CHOLECYSTECTOMY    . MOUTH SURGERY       OB History    Gravida  8   Para  3   Term  2   Preterm  1   AB  4   Living  3     SAB  4   TAB  0   Ectopic  0   Multiple  0   Live Births  2            Home Medications    Prior to Admission medications   Medication Sig Start Date End Date Taking? Authorizing Provider  acetaminophen (TYLENOL) 325 MG tablet Take 650 mg by mouth. 05/16/16  Yes [provider]    Family History History reviewed. No pertinent family history.  Social History Social History   Tobacco Use  . Smoking status: Current Every Day Smoker    Packs/day: 0.25    Years: 20.00    Pack years: 5.00    Types: Cigarettes  . Smokeless tobacco: Never Used  Substance Use Topics  . Alcohol use: No  . Drug use: No     Allergies   Onion; Latex; Other; Paroxetine; Paxil [paroxetine hcl]; Prednisone; and Propoxyphene   Review of Systems Review of Systems  Constitutional: Negative for fever.  HENT: Negative for hearing loss and sore throat.   Eyes:  Positive for photophobia. Negative for visual disturbance.  Respiratory: Negative for shortness of breath.   Cardiovascular: Negative for chest pain.  Gastrointestinal: Negative for abdominal pain.  Genitourinary: Negative for dysuria and frequency.  Musculoskeletal: Positive for neck pain. Negative for back pain.  Skin: Negative for rash.  Neurological: Positive for headaches.     Physical Exam Updated Vital Signs BP (!) 136/92 (BP Location: Left Arm)   Pulse 78   Temp 97.9 F (36.6 C) (Oral)   Resp 14   LMP 05/21/2014 (Exact Date)   SpO2 100%   Physical Exam  Constitutional: She appears well-developed and well-nourished. No distress.  HENT:  Head: Normocephalic and atraumatic.  Right Ear: External ear normal.  Left Ear: External ear normal.  Nose: Nose normal.  Mouth/Throat: Oropharynx is clear and moist.  Eyes: Pupils are equal, round,  and reactive to light. Conjunctivae and EOM are normal. Right eye exhibits no discharge. Left eye exhibits no discharge.  Neck: Neck supple. No tracheal deviation present.  Cardiovascular: Normal rate, regular rhythm, normal heart sounds and intact distal pulses.  No murmur heard. Pulmonary/Chest: Effort normal and breath sounds normal. No respiratory distress.  Abdominal: Soft. There is no tenderness.  Musculoskeletal: Normal range of motion. She exhibits no edema, tenderness or deformity.  Neurological: She is alert. She has normal strength. No cranial nerve deficit or sensory deficit. GCS eye subscore is 4. GCS verbal subscore is 5. GCS motor subscore is 6.  Skin: Skin is warm and dry. Capillary refill takes less than 2 seconds.  Psychiatric: She has a normal mood and affect.  Nursing note and vitals reviewed.    ED Treatments / Results  Labs (all labs ordered are listed, but only abnormal results are displayed) Labs Reviewed - No data to display  EKG None  Radiology No results found.  Procedures Procedures (including critical care time)  Medications Ordered in ED Medications  metoCLOPramide (REGLAN) injection 10 mg (has no administration in time range)  ketorolac (TORADOL) 30 MG/ML injection 30 mg (has no administration in time range)  sodium chloride 0.9 % bolus 1,000 mL (has no administration in time range)     Initial Impression / Assessment and Plan / ED Course  I have reviewed the triage vital signs and the nursing notes.  Pertinent labs & imaging results that were available during my care of the patient were reviewed by me and considered in my medical decision making (see chart for details).  Clinical Course as of Jul 18 1139  Tue Jul 16, 3915  [id number] 32 year old female with history of migraine headaches complaining of what she describes as her typical migraine headache with left-sided head pain with associated vomiting.  No history of trauma and no recent  illnesses.  She had a normal neurologic exam and clear sensorium.  She does have some neck pain but is reproducible right trapezius pain.  We are giving her symptomatic relief and will reevaluate   [MB]  1332 Reevaluated patient headache much improved and nausea resolved.  She would like to be discharged I think that is reasonable we will give her a note for work and she understands to rest and stay well-hydrated.   [MB]    Clinical Course User Index [MB] Hayden Rasmussen, MD     Final Clinical Impressions(s) / ED Diagnoses   Final diagnoses:  Migraine without status migrainosus, not intractable, unspecified migraine type    ED Discharge Orders    None  Hayden Rasmussen, MD 07/17/17 [phone]

## 2017-07-15 NOTE — ED Triage Notes (Signed)
Pt states has had migraine since this am. Nausea and vomiting since 3am per pt. Pt also complaining of right shoulder pain

## 2017-10-19 ENCOUNTER — Encounter (HOSPITAL_COMMUNITY): Payer: Self-pay

## 2017-10-19 ENCOUNTER — Emergency Department (HOSPITAL_COMMUNITY): Payer: Self-pay

## 2017-10-19 ENCOUNTER — Other Ambulatory Visit: Payer: Self-pay

## 2017-10-19 ENCOUNTER — Emergency Department (HOSPITAL_COMMUNITY)
Admission: EM | Admit: 2017-10-19 | Discharge: 2017-10-19 | Disposition: A | Discharge status: Home / Self Care | Payer: Self-pay | Attending: Emergency Medicine | Admitting: Emergency Medicine

## 2017-10-19 DIAGNOSIS — F17210 Nicotine dependence, cigarettes, uncomplicated: Secondary | ICD-10-CM | POA: Insufficient documentation

## 2017-10-19 DIAGNOSIS — R079 Chest pain, unspecified: Secondary | ICD-10-CM

## 2017-10-19 LAB — TROPONIN I

## 2017-10-19 LAB — CBC
HCT: 33.9 % — ABNORMAL LOW (ref 36.0–46.0)
HEMOGLOBIN: 11.9 g/dL — AB (ref 12.0–15.0)
MCH: 33.4 pg (ref 26.0–34.0)
MCHC: 35.1 g/dL (ref 30.0–36.0)
MCV: 95.2 fL (ref 78.0–100.0)
PLATELETS: 203 10*3/uL (ref 150–400)
RBC: 3.56 MIL/uL — AB (ref 3.87–5.11)
RDW: 11.6 % (ref 11.5–15.5)
WBC: 4.4 10*3/uL (ref 4.0–10.5)

## 2017-10-19 LAB — BASIC METABOLIC PANEL
ANION GAP: 4 — AB (ref 5–15)
BUN: 13 mg/dL (ref 6–20)
CALCIUM: 8.9 mg/dL (ref 8.9–10.3)
CO2: 26 mmol/L (ref 22–32)
CREATININE: 0.63 mg/dL (ref 0.44–1.00)
Chloride: 109 mmol/L (ref 98–111)
GLUCOSE: 100 mg/dL — AB (ref 70–99)
Potassium: 3.7 mmol/L (ref 3.5–5.1)
Sodium: 139 mmol/L (ref 135–145)

## 2017-10-19 MED ORDER — KETOROLAC TROMETHAMINE 30 MG/ML IJ SOLN
30.0000 mg | Freq: Once | INTRAMUSCULAR | Status: AC
  Administered 2017-10-19: 30 mg via INTRAVENOUS
  Filled 2017-10-19: qty 1

## 2017-10-19 MED ORDER — DICLOFENAC SODIUM 50 MG PO TBEC: 50.0000 mg | DELAYED_RELEASE_TABLET | Freq: Two times a day (BID) | ORAL | 0 refills | Status: DC

## 2017-10-19 NOTE — Discharge Instructions (Addendum)
You are slightly anemic.  Otherwise tests were good.  Prescription for anti-inflammatory pain medicine.

## 2017-10-19 NOTE — ED Triage Notes (Signed)
Pt reports that she was at work yesterday and right shoulder and chest started. Pt reports coughing today which has caused her to hurt to breath. Pt reports started shaking bad

## 2017-10-19 NOTE — ED Provider Notes (Signed)
[facility] EMERGENCY DEPARTMENT Provider Note   CSN: [id number] Arrival date & time: 10/19/17  1423     History   Chief Complaint Chief Complaint  Patient presents with  . Chest Pain    HPI Katherine Mack is a 32 y.o. female.  Central chest pain starting yesterday about 12:30 PM describes a squeezing sensation with some radiation to the right shoulder.  No dyspnea, diaphoresis, nausea.  No prior history of cardiac disease.  Nothing makes symptoms better or worse.  Past medical history includes a radical hysterectomy secondary to cervical cancer, Crohn's disease, depression.  She does smoke cigarettes.  Severity of symptoms is moderate.     Past Medical History:  Diagnosis Date  . Anxiety   . Cervical cancer ([place])    Dx: 04-2016  . Chronic back pain   . Crohn's disease ([place])   . Depression   . Sciatica     Patient Active Problem List   Diagnosis Date Noted  . 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 ABDOMINAL HYSTERECTOMY    . BUNIONECTOMY    . CHOLECYSTECTOMY    . MOUTH SURGERY       OB History    Gravida  8   Para  3   Term  2   Preterm  1   AB  4   Living  3     SAB  4   TAB  0   Ectopic  0   Multiple  0   Live Births  2            Home Medications    Prior to Admission medications   Medication Sig Start Date End Date Taking? Authorizing Provider  fluticasone (FLONASE) 50 MCG/ACT nasal spray Place 2 sprays into both nostrils daily as needed for allergies or rhinitis.   Yes [provider]  Pseudoephedrine-Ibuprofen 30-200 MG CAPS Take 4 capsules by mouth 2 (two) times daily as needed (pain).   Yes [provider]  diclofenac (VOLTAREN) 50 MG EC tablet Take 1 tablet (50 mg total) by mouth 2 (two) times daily. 10/19/17   Nat Christen, MD    Family History No family history on file.  Social History Social History   Tobacco Use  . Smoking status: Current Every Day  Smoker    Packs/day: 0.50    Years: 20.00    Pack years: 10.00    Types: Cigarettes  . Smokeless tobacco: Never Used  Substance Use Topics  . Alcohol use: No  . Drug use: No     Allergies   Onion; Latex; Other; Paroxetine; Paxil [paroxetine hcl]; Prednisone; and Propoxyphene   Review of Systems Review of Systems  All other systems reviewed and are negative.    Physical Exam Updated Vital Signs BP 137/90 (BP Location: Right Arm)   Pulse 66   Temp 98.1 F (36.7 C) (Oral)   Resp (!) 22   Ht 5\' 7"  (1.702 m)   Wt 57.6 kg   LMP 05/21/2014 (Exact Date)   SpO2 98%   BMI 19.89 kg/m   Physical Exam  Constitutional: She is oriented to person, place, and time. She appears well-developed and well-nourished.  HENT:  Head: Normocephalic and atraumatic.  Eyes: Conjunctivae are normal.  Neck: Neck supple.  Cardiovascular: Normal rate and regular rhythm.  Pulmonary/Chest: Effort normal and breath sounds normal.  Abdominal: Soft. Bowel sounds are normal.  Musculoskeletal: Normal range of motion.  Neurological: She is alert and oriented to person, place, and time.  Skin: Skin is warm and dry.  Psychiatric: She has a normal mood and affect. Her behavior is normal.  Nursing note and vitals reviewed.    ED Treatments / Results  Labs (all labs ordered are listed, but only abnormal results are displayed) Labs Reviewed  BASIC METABOLIC PANEL - Abnormal; Notable for the following components:      Result Value   Glucose, Bld 100 (*)    Anion gap 4 (*)    All other components within normal limits  CBC - Abnormal; Notable for the following components:   RBC 3.56 (*)    Hemoglobin 11.9 (*)    HCT 33.9 (*)    All other components within normal limits  TROPONIN I    EKG None  Date: 10/19/2017  Rate: 77  Rhythm: normal sinus rhythm  QRS Axis: normal  Intervals: normal  ST/T Wave abnormalities: normal  Conduction Disutrbances: none  Narrative Interpretation:  unremarkable    Radiology Dg Chest 2 View  Result Date: 10/19/2017 CLINICAL DATA:  Chest pain. EXAM: CHEST - 2 VIEW COMPARISON:  09/07/2014 FINDINGS: Cardiomediastinal silhouette is normal. Mediastinal contours appear intact. There is no evidence of focal airspace consolidation, pleural effusion or pneumothorax. Osseous structures are without acute abnormality. Soft tissues are grossly normal. IMPRESSION: No active cardiopulmonary disease. Electronically Signed   By: Fidela Salisbury M.D.   On: 10/19/2017 15:35    Procedures Procedures (including critical care time)  Medications Ordered in ED Medications  ketorolac (TORADOL) 30 MG/ML injection 30 mg (30 mg Intravenous Given 10/19/17 1640)     Initial Impression / Assessment and Plan / ED Course  I have reviewed the triage vital signs and the nursing notes.  Pertinent labs & imaging results that were available during my care of the patient were reviewed by me and considered in my medical decision making (see chart for details).     Patient presents with chest pain for 24 hours.  She is low risk for ACS or PE.  Screening tests including EKG, chest x-ray, troponin all negative.  She is minimally anemic.  IV Toradol seemed to help.  Patient is hemodynamically stable at discharge.  Discharge medication Voltaren 50 mg  Final Clinical Impressions(s) / ED Diagnoses   Final diagnoses:  Chest pain, unspecified type    ED Discharge Orders         Ordered    diclofenac (VOLTAREN) 50 MG EC tablet  2 times daily     10/19/17 1749           Nat Christen, MD 10/19/17 1844

## 2017-10-29 ENCOUNTER — Emergency Department (HOSPITAL_COMMUNITY)
Admission: EM | Admit: 2017-10-29 | Discharge: 2017-10-29 | Disposition: A | Discharge status: Home / Self Care | Payer: Self-pay | Attending: Emergency Medicine | Admitting: Emergency Medicine

## 2017-10-29 ENCOUNTER — Encounter (HOSPITAL_COMMUNITY): Payer: Self-pay | Admitting: Emergency Medicine

## 2017-10-29 ENCOUNTER — Emergency Department (HOSPITAL_COMMUNITY): Payer: Self-pay

## 2017-10-29 ENCOUNTER — Other Ambulatory Visit: Payer: Self-pay

## 2017-10-29 DIAGNOSIS — W19XXXA Unspecified fall, initial encounter: Secondary | ICD-10-CM

## 2017-10-29 DIAGNOSIS — Z91040 Latex allergy status: Secondary | ICD-10-CM | POA: Insufficient documentation

## 2017-10-29 DIAGNOSIS — W500XXA Accidental hit or strike by another person, initial encounter: Secondary | ICD-10-CM | POA: Insufficient documentation

## 2017-10-29 DIAGNOSIS — Z8541 Personal history of malignant neoplasm of cervix uteri: Secondary | ICD-10-CM | POA: Insufficient documentation

## 2017-10-29 DIAGNOSIS — M25571 Pain in right ankle and joints of right foot: Secondary | ICD-10-CM | POA: Insufficient documentation

## 2017-10-29 DIAGNOSIS — Z79899 Other long term (current) drug therapy: Secondary | ICD-10-CM | POA: Insufficient documentation

## 2017-10-29 DIAGNOSIS — Y939 Activity, unspecified: Secondary | ICD-10-CM | POA: Insufficient documentation

## 2017-10-29 DIAGNOSIS — F17210 Nicotine dependence, cigarettes, uncomplicated: Secondary | ICD-10-CM | POA: Insufficient documentation

## 2017-10-29 DIAGNOSIS — Y999 Unspecified external cause status: Secondary | ICD-10-CM | POA: Insufficient documentation

## 2017-10-29 DIAGNOSIS — Y92008 Other place in unspecified non-institutional (private) residence as the place of occurrence of the external cause: Secondary | ICD-10-CM | POA: Insufficient documentation

## 2017-10-29 DIAGNOSIS — M79671 Pain in right foot: Secondary | ICD-10-CM | POA: Insufficient documentation

## 2017-10-29 NOTE — Discharge Instructions (Addendum)
X-ray showed no fracture.  Ankle brace, ice, elevate, Tylenol or ibuprofen for pain.
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
## 2017-12-28 ENCOUNTER — Emergency Department (HOSPITAL_COMMUNITY)
Admission: EM | Admit: 2017-12-28 | Discharge: 2017-12-28 | Disposition: A | Discharge status: Home / Self Care | Payer: Medicaid Other | Attending: Emergency Medicine | Admitting: Emergency Medicine

## 2017-12-28 ENCOUNTER — Emergency Department (HOSPITAL_COMMUNITY): Payer: Medicaid Other

## 2017-12-28 ENCOUNTER — Other Ambulatory Visit: Payer: Self-pay

## 2017-12-28 ENCOUNTER — Encounter (HOSPITAL_COMMUNITY): Payer: Self-pay | Admitting: Emergency Medicine

## 2017-12-28 DIAGNOSIS — Z20828 Contact with and (suspected) exposure to other viral communicable diseases: Secondary | ICD-10-CM | POA: Insufficient documentation

## 2017-12-28 DIAGNOSIS — J189 Pneumonia, unspecified organism: Secondary | ICD-10-CM | POA: Insufficient documentation

## 2017-12-28 DIAGNOSIS — F17210 Nicotine dependence, cigarettes, uncomplicated: Secondary | ICD-10-CM | POA: Insufficient documentation

## 2017-12-28 DIAGNOSIS — J181 Lobar pneumonia, unspecified organism: Secondary | ICD-10-CM

## 2017-12-28 LAB — CBC WITH DIFFERENTIAL/PLATELET
Abs Immature Granulocytes: 0.02 10*3/uL (ref 0.00–0.07)
BASOS ABS: 0 10*3/uL (ref 0.0–0.1)
BASOS PCT: 0 %
EOS ABS: 0.1 10*3/uL (ref 0.0–0.5)
Eosinophils Relative: 1 %
HCT: 39.5 % (ref 36.0–46.0)
HEMOGLOBIN: 13.6 g/dL (ref 12.0–15.0)
IMMATURE GRANULOCYTES: 0 %
LYMPHS ABS: 2.6 10*3/uL (ref 0.7–4.0)
Lymphocytes Relative: 41 %
MCH: 32.2 pg (ref 26.0–34.0)
MCHC: 34.4 g/dL (ref 30.0–36.0)
MCV: 93.6 fL (ref 80.0–100.0)
MONOS PCT: 5 %
Monocytes Absolute: 0.3 10*3/uL (ref 0.1–1.0)
NEUTROS ABS: 3.4 10*3/uL (ref 1.7–7.7)
NRBC: 0 % (ref 0.0–0.2)
Neutrophils Relative %: 53 %
PLATELETS: 284 10*3/uL (ref 150–400)
RBC: 4.22 MIL/uL (ref 3.87–5.11)
RDW: 11.4 % — ABNORMAL LOW (ref 11.5–15.5)
WBC: 6.4 10*3/uL (ref 4.0–10.5)

## 2017-12-28 LAB — COMPREHENSIVE METABOLIC PANEL
ALBUMIN: 4.4 g/dL (ref 3.5–5.0)
ALT: 16 U/L (ref 0–44)
ANION GAP: 5 (ref 5–15)
AST: 18 U/L (ref 15–41)
Alkaline Phosphatase: 41 U/L (ref 38–126)
BUN: 12 mg/dL (ref 6–20)
CO2: 24 mmol/L (ref 22–32)
Calcium: 9.1 mg/dL (ref 8.9–10.3)
Chloride: 107 mmol/L (ref 98–111)
Creatinine, Ser: 0.58 mg/dL (ref 0.44–1.00)
GFR calc non Af Amer: >60 mL/min (ref >60)
GLUCOSE: 91 mg/dL (ref 70–99)
POTASSIUM: 3.6 mmol/L (ref 3.5–5.1)
SODIUM: 136 mmol/L (ref 135–145)
TOTAL PROTEIN: 7.6 g/dL (ref 6.5–8.1)
Total Bilirubin: 0.8 mg/dL (ref 0.3–1.2)

## 2017-12-28 LAB — URINALYSIS, ROUTINE W REFLEX MICROSCOPIC
Bilirubin Urine: NEGATIVE
Glucose, UA: NEGATIVE mg/dL
HGB URINE DIPSTICK: NEGATIVE
Ketones, ur: NEGATIVE mg/dL
Leukocytes, UA: NEGATIVE
NITRITE: NEGATIVE
PROTEIN: NEGATIVE mg/dL
SPECIFIC GRAVITY, URINE: 1.015 (ref 1.005–1.030)
pH: 6 (ref 5.0–8.0)

## 2017-12-28 LAB — GROUP A STREP BY PCR: Group A Strep by PCR: NOT DETECTED

## 2017-12-28 LAB — INFLUENZA PANEL BY PCR (TYPE A & B)
Influenza A By PCR: NEGATIVE
Influenza B By PCR: NEGATIVE

## 2017-12-28 MED ORDER — AZITHROMYCIN 250 MG PO TABS
500.0000 mg | ORAL_TABLET | Freq: Once | ORAL | Status: AC
  Administered 2017-12-28: 500 mg via ORAL
  Filled 2017-12-28: qty 2

## 2017-12-28 MED ORDER — SODIUM CHLORIDE 0.9 % IV SOLN
1.0000 g | Freq: Once | INTRAVENOUS | Status: AC
  Administered 2017-12-28: 1 g via INTRAVENOUS
  Filled 2017-12-28: qty 10

## 2017-12-28 MED ORDER — BENZONATATE 100 MG PO CAPS
200.0000 mg | ORAL_CAPSULE | Freq: Once | ORAL | Status: AC
  Administered 2017-12-28: 200 mg via ORAL
  Filled 2017-12-28: qty 2

## 2017-12-28 MED ORDER — AZITHROMYCIN 250 MG PO TABS: ORAL_TABLET | ORAL | 0 refills | Status: DC

## 2017-12-28 MED ORDER — SODIUM CHLORIDE 0.9 % IV BOLUS
1000.0000 mL | Freq: Once | INTRAVENOUS | Status: AC
  Administered 2017-12-28: 1000 mL via INTRAVENOUS

## 2017-12-28 MED ORDER — BENZONATATE 100 MG PO CAPS: 200.0000 mg | ORAL_CAPSULE | Freq: Three times a day (TID) | ORAL | 0 refills | Status: DC | PRN

## 2017-12-28 NOTE — ED Provider Notes (Signed)
[facility] EMERGENCY DEPARTMENT Provider Note   CSN: [id number] Arrival date & time: 12/28/17  1303     History   Chief Complaint Chief Complaint  Patient presents with  . Influenza    HPI Katherine Mack is a 32 y.o. female presenting with a 3 week history of cough productive of a yellow sputum production in association with nasal congestion and post nasal drip.  She has had subjective fevers, generalized fatigue and myalgias.  She reports she has 2 children currently with influenza and her boyfriend has pneumonia.  She also reports nausea, decreased PO intake and decreased urinary frequency, stating she has only urinated 3 times in the past 2 days.  She has had no dysuria or hematuria, denies back or flank pain and no diarrhea, also denies sinus or ear pain.  She has taken otc cough and cold remedies without relief.  The history is provided by the patient.    Past Medical History:  Diagnosis Date  . Anxiety   . Cervical cancer ([place])    Dx: 04-2016  . Chronic back pain   . Crohn's disease ([place])   . Depression   . Sciatica     Patient Active Problem List   Diagnosis Date Noted  . 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 ABDOMINAL HYSTERECTOMY    . BUNIONECTOMY    . CHOLECYSTECTOMY    . MOUTH SURGERY       OB History    Gravida  8   Para  3   Term  2   Preterm  1   AB  4   Living  3     SAB  4   TAB  0   Ectopic  0   Multiple  0   Live Births  2            Home Medications    Prior to Admission medications   Medication Sig Start Date End Date Taking? Authorizing Provider  albuterol (PROVENTIL HFA;VENTOLIN HFA) 108 (90 Base) MCG/ACT inhaler Inhale 1-2 puffs into the lungs every 6 (six) hours as needed for wheezing or shortness of breath.   Yes [provider]  phenol (CHLORASEPTIC) 1.4 % LIQD Use as directed 1 spray in the mouth or throat as needed for throat irritation / pain.   Yes  [provider]  Phenyleph-CPM-DM-APAP (ALKA-SELTZER PLUS COLD & FLU PO) Take 2 capsules by mouth every 6 (six) hours as needed (cough and congestion).   Yes [provider]  traZODone (DESYREL) 100 MG tablet Take 100 mg by mouth at bedtime.   Yes [provider]  azithromycin (ZITHROMAX Z-PAK) 250 MG tablet Take one tablet daily for 4 days 12/28/17   Kasidy Gianino, Almyra Free, PA-C  benzonatate (TESSALON) 100 MG capsule Take 2 capsules (200 mg total) by mouth 3 (three) times daily as needed. 12/28/17   Evalee Jefferson, PA-C  diclofenac (VOLTAREN) 50 MG EC tablet Take 1 tablet (50 mg total) by mouth 2 (two) times daily. 10/19/17   Nat Christen, MD    Family History No family history on file.  Social History Social History   Tobacco Use  . Smoking status: Current Every Day Smoker    Packs/day: 0.50    Years: 20.00    Pack years: 10.00    Types: Cigarettes  . Smokeless tobacco: Never Used  Substance Use Topics  . Alcohol use: No  . Drug use: No  Allergies   Onion; Latex; Other; Paroxetine; Paxil [paroxetine hcl]; Prednisone; and Propoxyphene   Review of Systems Review of Systems  Constitutional: Positive for chills. Negative for fever.  HENT: Positive for postnasal drip, rhinorrhea and sore throat. Negative for congestion, ear pain, sinus pressure, sinus pain, trouble swallowing and voice change.   Eyes: Negative.  Negative for discharge.  Respiratory: Positive for cough. Negative for chest tightness, shortness of breath, wheezing and stridor.   Cardiovascular: Negative for chest pain.  Gastrointestinal: Positive for nausea. Negative for abdominal pain and vomiting.  Genitourinary: Positive for decreased urine volume.  Musculoskeletal: Negative for arthralgias, joint swelling and neck pain.  Skin: Negative.  Negative for rash and wound.  Neurological: Negative for dizziness, weakness, light-headedness, numbness and headaches.  Psychiatric/Behavioral: Negative.       Physical Exam Updated Vital Signs BP (!) 142/98 (BP Location: Right Arm)   Pulse 78   Temp 98 F (36.7 C)   Resp 18   Ht 5' 7.5" (1.715 m)   Wt 57.6 kg   LMP 05/21/2014 (Exact Date)   SpO2 98%   BMI 19.60 kg/m   Physical Exam  Constitutional: She appears well-developed and well-nourished.  Appears fatigued.  Frequent dry cough.  HENT:  Head: Normocephalic and atraumatic.  Right Ear: Tympanic membrane and ear canal normal.  Left Ear: Tympanic membrane and ear canal normal.  Nose: Mucosal edema and rhinorrhea present.  Mouth/Throat: Uvula is midline and oropharynx is clear and moist. No oropharyngeal exudate, posterior oropharyngeal edema or posterior oropharyngeal erythema.  Eyes: Conjunctivae are normal.  Neck: Normal range of motion.  Cardiovascular: Normal rate, regular rhythm, normal heart sounds and intact distal pulses.  Pulmonary/Chest: Effort normal and breath sounds normal. No respiratory distress. She has no wheezes.  Abdominal: Soft. Bowel sounds are normal. She exhibits no distension and no mass. There is no tenderness. There is no guarding.  Musculoskeletal: Normal range of motion.  Neurological: She is alert.  Skin: Skin is warm and dry.  Psychiatric: She has a normal mood and affect.  Nursing note and vitals reviewed.    ED Treatments / Results  Labs (all labs ordered are listed, but only abnormal results are displayed) Labs Reviewed  CBC WITH DIFFERENTIAL/PLATELET - Abnormal; Notable for the following components:      Result Value   RDW 11.4 (*)    All other components within normal limits  URINALYSIS, ROUTINE W REFLEX MICROSCOPIC - Abnormal; Notable for the following components:   APPearance HAZY (*)    All other components within normal limits  GROUP A STREP BY PCR  INFLUENZA PANEL BY PCR (TYPE A & B)  COMPREHENSIVE METABOLIC PANEL    EKG None  Radiology Dg Chest 2 View  Result Date: 12/28/2017 CLINICAL DATA:  32 year-old with cough.  Smoker and history of cervical cancer. EXAM: CHEST - 2 VIEW COMPARISON:  10/19/2017 FINDINGS: Bilateral nipple shadows in the lower chest. Hazy densities along the medial right lung base could represent overlying shadows and there are not correlating densities on the lateral view. Lungs appear clear on the lateral view. No pleural effusions. Heart and mediastinum are within normal limits. Trachea is midline. Negative for a pneumothorax. Bone structures are unremarkable. IMPRESSION: Hazy densities along the medial right lower lung on the PA view. These densities are not clearly identified on the lateral view. Findings could represent atelectasis or atypical infection. Electronically Signed   By: Markus Daft M.D.   On: 12/28/2017 14:24  Procedures Procedures (including critical care time)  Medications Ordered in ED Medications  sodium chloride 0.9 % bolus 1,000 mL (0 mLs Intravenous Stopped 12/28/17 1619)  benzonatate (TESSALON) capsule 200 mg (200 mg Oral Given 12/28/17 1505)  azithromycin (ZITHROMAX) tablet 500 mg (500 mg Oral Given 12/28/17 1614)  cefTRIAXone (ROCEPHIN) 1 g in sodium chloride 0.9 % 100 mL IVPB (0 g Intravenous Stopped 12/28/17 1639)     Initial Impression / Assessment and Plan / ED Course  I have reviewed the triage vital signs and the nursing notes.  Pertinent labs & imaging results that were available during my care of the patient were reviewed by me and considered in my medical decision making (see chart for details).     Pt was given IV fluids given hx of decreased urine production.  Labs results reviewed and discussed with pt, which are reassuring.  There is a suggestion of pneumonia on imaging vs atelectasis. Will cover with zithromax . Tessalon given here with improved frequency of cough.rest, fluids, prn recheck, return precautions discussed.  Pt also with influenza exposure but negative today.  Final Clinical Impressions(s) / ED Diagnoses   Final diagnoses:    Community acquired pneumonia of right lower lobe of lung ([place])  Exposure to influenza    ED Discharge Orders         Ordered    benzonatate (TESSALON) 100 MG capsule  3 times daily PRN     12/28/17 1705    azithromycin (ZITHROMAX Z-PAK) 250 MG tablet     12/28/17 1705           Evalee Jefferson, PA-C 12/28/17 2145    Virgel Manifold, MD 12/29/17 [phone]

## 2017-12-28 NOTE — ED Triage Notes (Signed)
Pt states that she has a sore throat body aches coughing up yellow/ green stuff

## 2017-12-28 NOTE — Discharge Instructions (Signed)
Take your next dose of the antibiotic tomorrow evening.  Rest and make sure you are drinking plenty of fluids.  Use Tylenol or Motrin if needed for fever or body aches.

## 2018-05-11 ENCOUNTER — Emergency Department (HOSPITAL_COMMUNITY): Payer: Self-pay

## 2018-05-11 ENCOUNTER — Encounter (HOSPITAL_COMMUNITY): Payer: Self-pay | Admitting: Emergency Medicine

## 2018-05-11 ENCOUNTER — Emergency Department (HOSPITAL_COMMUNITY)
Admission: EM | Admit: 2018-05-11 | Discharge: 2018-05-11 | Disposition: A | Discharge status: Home / Self Care | Payer: Self-pay | Attending: Emergency Medicine | Admitting: Emergency Medicine

## 2018-05-11 ENCOUNTER — Other Ambulatory Visit: Payer: Self-pay

## 2018-05-11 DIAGNOSIS — R112 Nausea with vomiting, unspecified: Secondary | ICD-10-CM | POA: Insufficient documentation

## 2018-05-11 DIAGNOSIS — Z91040 Latex allergy status: Secondary | ICD-10-CM | POA: Insufficient documentation

## 2018-05-11 DIAGNOSIS — K5900 Constipation, unspecified: Secondary | ICD-10-CM | POA: Insufficient documentation

## 2018-05-11 DIAGNOSIS — R52 Pain, unspecified: Secondary | ICD-10-CM

## 2018-05-11 DIAGNOSIS — Z79899 Other long term (current) drug therapy: Secondary | ICD-10-CM | POA: Insufficient documentation

## 2018-05-11 DIAGNOSIS — N83519 Torsion of ovary and ovarian pedicle, unspecified side: Secondary | ICD-10-CM

## 2018-05-11 DIAGNOSIS — R1031 Right lower quadrant pain: Secondary | ICD-10-CM | POA: Insufficient documentation

## 2018-05-11 DIAGNOSIS — F17210 Nicotine dependence, cigarettes, uncomplicated: Secondary | ICD-10-CM | POA: Insufficient documentation

## 2018-05-11 DIAGNOSIS — R109 Unspecified abdominal pain: Secondary | ICD-10-CM

## 2018-05-11 MED ORDER — SODIUM CHLORIDE 0.9 % IV BOLUS
1000.0000 mL | Freq: Once | INTRAVENOUS | Status: AC
  Administered 2018-05-11: 1000 mL via INTRAVENOUS

## 2018-05-11 MED ORDER — FENTANYL CITRATE (PF) 100 MCG/2ML IJ SOLN
50.0000 ug | Freq: Once | INTRAMUSCULAR | Status: AC
  Administered 2018-05-11: 50 ug via INTRAVENOUS
  Filled 2018-05-11: qty 2

## 2018-05-11 MED ORDER — METOCLOPRAMIDE HCL 5 MG/ML IJ SOLN
10.0000 mg | Freq: Once | INTRAMUSCULAR | Status: AC
  Administered 2018-05-11: 10 mg via INTRAVENOUS
  Filled 2018-05-11: qty 2

## 2018-05-11 MED ORDER — KETOROLAC TROMETHAMINE 30 MG/ML IJ SOLN
30.0000 mg | Freq: Once | INTRAMUSCULAR | Status: AC
  Administered 2018-05-11: 30 mg via INTRAVENOUS
  Filled 2018-05-11: qty 1

## 2018-05-11 MED ORDER — BISACODYL 10 MG RE SUPP
10.0000 mg | Freq: Once | RECTAL | Status: AC
  Administered 2018-05-11: 10 mg via RECTAL
  Filled 2018-05-11: qty 1

## 2018-05-11 MED ORDER — ONDANSETRON HCL 4 MG/2ML IJ SOLN
4.0000 mg | Freq: Once | INTRAMUSCULAR | Status: AC
  Administered 2018-05-11: 4 mg via INTRAVENOUS
  Filled 2018-05-11: qty 2

## 2018-05-11 MED ORDER — ONDANSETRON HCL 4 MG PO TABS: 4.0000 mg | ORAL_TABLET | Freq: Three times a day (TID) | ORAL | 0 refills | Status: DC | PRN

## 2018-05-11 NOTE — ED Provider Notes (Signed)
[facility] EMERGENCY DEPARTMENT Provider Note   CSN: [id number] Arrival date & time: 05/11/18  0125  Time seen 1:44 AM  History   Chief Complaint Chief Complaint  Patient presents with   Abdominal Pain    HPI Katherine Mack is a 33 y.o. female.     HPI patient states at 10:30 PM she had onset of some right lower quadrant abdominal pain that started gradually and got worse.  She states she had some mild pain in her right flank area.  She describes the pain as sharp and burning.  She estimates she has vomited over 20 times.  She denies diarrhea, dysuria, hematuria, and feels like she may have a fever.  Patient states she is status post hysterectomy for cervical cancer in 2018.  She states they "tacked up my ovaries".  She is also status post cholecystectomy.  She states she has had kidney stones in the past and was actually admitted for 2 weeks once with an infection.  She states however this is different from her kidney stones which usually hurt more in her back.  She denies being around anybody else who is sick, she denies eating anything that could have made her feel worse.  [facility]   Past Medical History:  Diagnosis Date   Anxiety    Cervical cancer [facility])    Dx: 04-2016   Chronic back pain    Crohn's disease ([place])    Depression    Sciatica     Patient Active Problem List   Diagnosis Date Noted   Cervical dysplasia 05/07/2016    Past Surgical History:  Procedure Laterality Date   ABDOMINAL HYSTERECTOMY     BUNIONECTOMY     CHOLECYSTECTOMY     MOUTH SURGERY       OB History    Gravida  8   Para  3   Term  2   Preterm  1   AB  4   Living  3     SAB  4   TAB  0   Ectopic  0   Multiple  0   Live Births  2            Home Medications    States she only takes trazodone  Prior to Admission medications   Medication Sig Start Date End Date Taking? Authorizing Provider  albuterol (PROVENTIL  HFA;VENTOLIN HFA) 108 (90 Base) MCG/ACT inhaler Inhale 1-2 puffs into the lungs every 6 (six) hours as needed for wheezing or shortness of breath.    [provider]  azithromycin (ZITHROMAX Z-PAK) 250 MG tablet Take one tablet daily for 4 days 12/28/17   Idol, Almyra Free, PA-C  benzonatate (TESSALON) 100 MG capsule Take 2 capsules (200 mg total) by mouth 3 (three) times daily as needed. 12/28/17   Evalee Jefferson, PA-C  diclofenac (VOLTAREN) 50 MG EC tablet Take 1 tablet (50 mg total) by mouth 2 (two) times daily. 10/19/17   Nat Christen, MD  ondansetron (ZOFRAN) 4 MG tablet Take 1 tablet (4 mg total) by mouth every 8 (eight) hours as needed. 05/11/18   Rolland Porter, MD  phenol (CHLORASEPTIC) 1.4 % LIQD Use as directed 1 spray in the mouth or throat as needed for throat irritation / pain.    [provider]  Phenyleph-CPM-DM-APAP (ALKA-SELTZER PLUS COLD & FLU PO) Take 2 capsules by mouth every 6 (six) hours as needed (cough and congestion).    [provider]  traZODone (  DESYREL) 100 MG tablet Take 100 mg by mouth at bedtime.    [provider]    Family History No family history on file.  Social History Social History   Tobacco Use   Smoking status: Current Every Day Smoker    Packs/day: 0.50    Years: 20.00    Pack years: 10.00    Types: Cigarettes   Smokeless tobacco: Never Used  Substance Use Topics   Alcohol use: No   Drug use: No  employed   Allergies   Onion; Latex; Other; Paroxetine; Paxil [paroxetine hcl]; Prednisone; and Propoxyphene   Review of Systems Review of Systems  All other systems reviewed and are negative.    Physical Exam Updated Vital Signs BP (!) 140/95 (BP Location: Left Arm)    Pulse 85    Temp 98 F (36.7 C) (Oral)    Resp (!) 22    Ht 5' 7.5" (1.715 m)    Wt 54.9 kg    LMP 05/21/2014 (Exact Date)    SpO2 98%    BMI 18.67 kg/m   Vital signs normal    Physical Exam Vitals signs and nursing note reviewed.    Constitutional:      General: She is in acute distress.     Appearance: Normal appearance. She is well-developed. She is not ill-appearing or toxic-appearing.  HENT:     Head: Normocephalic and atraumatic.     Right Ear: External ear normal.     Left Ear: External ear normal.     Nose: Nose normal. No mucosal edema or rhinorrhea.     Mouth/Throat:     Mouth: Mucous membranes are moist.     Dentition: No dental abscesses.     Pharynx: No uvula swelling.  Eyes:     Conjunctiva/sclera: Conjunctivae normal.     Pupils: Pupils are equal, round, and reactive to light.  Neck:     Musculoskeletal: Full passive range of motion without pain, normal range of motion and neck supple.  Cardiovascular:     Rate and Rhythm: Normal rate and regular rhythm.     Pulses: Normal pulses.     Heart sounds: Normal heart sounds. No murmur. No friction rub. No gallop.   Pulmonary:     Effort: Pulmonary effort is normal. No respiratory distress.     Breath sounds: Normal breath sounds. No wheezing, rhonchi or rales.  Chest:     Chest wall: No tenderness or crepitus.  Abdominal:     General: Bowel sounds are normal. There is no distension.     Palpations: Abdomen is soft.     Tenderness: There is abdominal tenderness. There is no guarding or rebound.     Comments: Patient has mild lower right CVA tenderness to percussion, none on the left.  She is tender in her left lower quadrant without guarding or rebound.  Please note patient is sitting with her knees flexed.  Musculoskeletal: Normal range of motion.        General: No tenderness.     Comments: Moves all extremities well.   Skin:    General: Skin is warm and dry.     Coloration: Skin is not pale.     Findings: No erythema or rash.  Neurological:     Mental Status: She is alert and oriented to person, place, and time.     Cranial Nerves: No cranial nerve deficit.  Psychiatric:        Mood and Affect: Mood is anxious.  Speech: Speech is  rapid and pressured.        Behavior: Behavior is cooperative.      ED Treatments / Results  Labs (all labs ordered are listed, but only abnormal results are displayed) Labs Reviewed  URINALYSIS, ROUTINE W REFLEX MICROSCOPIC    EKG None  Radiology Ct Renal Stone Study  Result Date: 05/11/2018 CLINICAL DATA:  33 year old female with right flank pain. Concern for recurrent stone disease. History of cervical cancer status post prior radical hysterectomy. EXAM: CT ABDOMEN AND PELVIS WITHOUT CONTRAST TECHNIQUE: Multidetector CT imaging of the abdomen and pelvis was performed following the standard protocol without IV contrast. COMPARISON:  CT of the abdomen pelvis dated 11/26/2016 FINDINGS: Evaluation of this exam is limited in the absence of intravenous contrast. Lower chest: The visualized lung bases are clear. No intra-abdominal free air or free fluid. Hepatobiliary: The liver is unremarkable. No intrahepatic biliary ductal dilatation. Cholecystectomy. Pancreas: Unremarkable. No pancreatic ductal dilatation or surrounding inflammatory changes. Spleen: Normal in size without focal abnormality. Adrenals/Urinary Tract: The adrenal glands are unremarkable. There is no hydronephrosis or nephrolithiasis on either side. Faint 1 cm right renal hypodense lesion is not well characterized but appears similar to prior CT most consistent with a cyst. The visualized ureters and urinary bladder appear unremarkable. Stomach/Bowel: There is moderate stool throughout the colon. No bowel obstruction or active inflammation. The appendix is normal. Vascular/Lymphatic: The abdominal aorta and IVC are unremarkable on this noncontrast CT. No portal venous gas. There is no adenopathy. Reproductive: Hysterectomy. No pelvic mass. Other: Interval resection of the previously seen cystic structure in the left lower quadrant/left hemipelvis. There is a 3.5 x 1.7 cm ovoid structure in the right hemipelvis posterior to the  ascending colon (series 2, image 67 and coronal series 5, image 38). This may represent the right ovary. Attention on follow-up imaging recommended. Musculoskeletal: No acute or significant osseous findings. IMPRESSION: 1. No acute intra-abdominal or pelvic pathology. No hydronephrosis or nephrolithiasis. 2. 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 An ovoid soft tissue structure in the right hemipelvis posterior to the ascending colon likely represents the right ovary. Attention to this area on follow-up imaging recommended. Electronically Signed   By: Anner Crete M.D.   On: 05/11/2018 02:41    Procedures Procedures (including critical care time)  Biopsy was obtained in the office on April 26, 2016 by Dr. Fermin Schwab resulting: [place]. HIGH GRADE DYSPLASIA INVOLVES UNDERLYING ENDOCERVICAL GLANDS. ASSOCIATED ACUTE AND CHRONIC CERVICITIS.  The patient underwent a radical hysterectomy and bilateral salpingectomy and bilateral pelvic lymphadenectomy on 05/14/2016.  Medications Ordered in ED Medications  fentaNYL (SUBLIMAZE) injection 50 mcg (50 mcg Intravenous Given 05/11/18 0202)  ondansetron (ZOFRAN) injection 4 mg (4 mg Intravenous Given 05/11/18 0202)  ketorolac (TORADOL) 30 MG/ML injection 30 mg (30 mg Intravenous Given 05/11/18 0303)  metoCLOPramide (REGLAN) injection 10 mg (10 mg Intravenous Given 05/11/18 0303)  bisacodyl (DULCOLAX) suppository 10 mg (10 mg Rectal Given 05/11/18 0303)  sodium chloride 0.9 % bolus 1,000 mL (1,000 mLs Intravenous New Bag/Given 05/11/18 0554)     Initial Impression / Assessment and Plan / ED Course  I have reviewed the triage vital signs and the nursing notes.  Pertinent labs & imaging results that were available during my care of the patient were reviewed by me and considered in my  medical decision making (see chart  for details).    Patient appears to be very distressed and in pain.  She can be heard moaning outside her room.  She was given fentanyl and Zofran for her pain and nausea.  CT renal was done for suspected renal stone.  Also considered was ovarian torsion however she states "my ovaries were tacked up".  Less likely would be appendicitis which generally does not present so acutely.  Her last CT scan in 2018 when she had right-sided abdominal pain was only abnormal for constipation.  When I checked the [place] she has had no narcotic prescriptions since 2018.    2:50 AM I reviewed patient's CT scan with the patient.  It mainly shows a lot of stool especially in her right colon.  She is still complaining of a lot of pain and nausea.  She was given Reglan IV and Toradol.  She states she has Crohn's disease and she has 2 or 3 loose stools daily.  Based on her CT I gave her a Dulcolax suppository.  Due to her degree of discomfort ultrasound of the pelvis was done to look for ovarian torsion.  Recheck at 5:30 AM patient was informed her ultrasound did not show anything new.  She states she had a small bowel movement after the Dulcolax suppository and her pain is improved.  Patient will be discharged home to treat her constipation.  Final Clinical Impressions(s) / ED Diagnoses   Final diagnoses:  Pain  Right sided abdominal pain  Constipation, unspecified constipation type  Non-intractable vomiting with nausea, unspecified vomiting type    ED Discharge Orders         Ordered    ondansetron (ZOFRAN) 4 MG tablet  Every 8 hours PRN     05/11/18 0537        OTC dulcolax suppositories and miralax  Plan discharge  Rolland Porter, MD, Barbette Or, MD 05/11/18 [phone]

## 2018-05-11 NOTE — Discharge Instructions (Addendum)
Get Ducalox suppositories in the drugstore and use it at bedtime for the next several nights.Get miralax and put one dose or 17 g in 8 ounces of water,  take 1 dose every 30 minutes for 2-3 hours or until you  get good results and then once or twice daily to prevent constipation.   Recheck if you get a fever or have uncontrolled vomiting.  Use the Zofran as needed.

## 2018-05-11 NOTE — ED Triage Notes (Signed)
Pt C/O right sided abdominal pain that started around 2230 last night. Pt C/O chills, nausea, and vomiting.

## 2021-07-11 ENCOUNTER — Emergency Department (HOSPITAL_COMMUNITY)
Admission: EM | Admit: 2021-07-11 | Discharge: 2021-07-12 | Disposition: A | Discharge status: Home / Self Care | Payer: Medicaid Other | Attending: Emergency Medicine | Admitting: Emergency Medicine

## 2021-07-11 ENCOUNTER — Other Ambulatory Visit: Payer: Self-pay

## 2021-07-11 ENCOUNTER — Encounter (HOSPITAL_COMMUNITY): Payer: Self-pay | Admitting: *Deleted

## 2021-07-11 ENCOUNTER — Emergency Department (HOSPITAL_COMMUNITY): Payer: Medicaid Other

## 2021-07-11 DIAGNOSIS — Z91040 Latex allergy status: Secondary | ICD-10-CM | POA: Diagnosis not present

## 2021-07-11 DIAGNOSIS — G43109 Migraine with aura, not intractable, without status migrainosus: Secondary | ICD-10-CM | POA: Insufficient documentation

## 2021-07-11 DIAGNOSIS — R519 Headache, unspecified: Secondary | ICD-10-CM | POA: Diagnosis present

## 2021-07-11 LAB — CBC
HCT: 30.8 % — ABNORMAL LOW (ref 36.0–46.0)
Hemoglobin: 11 g/dL — ABNORMAL LOW (ref 12.0–15.0)
MCH: 33.6 pg (ref 26.0–34.0)
MCHC: 35.7 g/dL (ref 30.0–36.0)
MCV: 94.2 fL (ref 80.0–100.0)
Platelets: 219 10*3/uL (ref 150–400)
RBC: 3.27 MIL/uL — ABNORMAL LOW (ref 3.87–5.11)
RDW: 12.2 % (ref 11.5–15.5)
WBC: 4.2 10*3/uL (ref 4.0–10.5)
nRBC: 0 % (ref 0.0–0.2)

## 2021-07-11 LAB — BASIC METABOLIC PANEL
Anion gap: <3 — ABNORMAL LOW (ref 5–15)
BUN: 15 mg/dL (ref 6–20)
CO2: 21 mmol/L — ABNORMAL LOW (ref 22–32)
Calcium: 8.1 mg/dL — ABNORMAL LOW (ref 8.9–10.3)
Chloride: 112 mmol/L — ABNORMAL HIGH (ref 98–111)
Creatinine, Ser: 0.7 mg/dL (ref 0.44–1.00)
GFR, Estimated: >60 mL/min (ref >60)
Glucose, Bld: 93 mg/dL (ref 70–99)
Potassium: 3.7 mmol/L (ref 3.5–5.1)
Sodium: 135 mmol/L (ref 135–145)

## 2021-07-11 MED ORDER — PROCHLORPERAZINE EDISYLATE 10 MG/2ML IJ SOLN
10.0000 mg | Freq: Once | INTRAMUSCULAR | Status: AC
  Administered 2021-07-11: 10 mg via INTRAVENOUS
  Filled 2021-07-11: qty 2

## 2021-07-11 MED ORDER — DIPHENHYDRAMINE HCL 50 MG/ML IJ SOLN
25.0000 mg | Freq: Once | INTRAMUSCULAR | Status: AC
  Administered 2021-07-11: 25 mg via INTRAVENOUS
  Filled 2021-07-11: qty 1

## 2021-07-11 MED ORDER — MORPHINE SULFATE (PF) 4 MG/ML IV SOLN
4.0000 mg | Freq: Once | INTRAVENOUS | Status: AC
  Administered 2021-07-11: 4 mg via INTRAVENOUS
  Filled 2021-07-11: qty 1

## 2021-07-11 NOTE — ED Triage Notes (Signed)
Pt states she was just at [facility] for facial swelling and unable to open left eye  Pt c/o weakness to legs and states they feel numb  Pt states she feels like she can't balance and has a migraine
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
## 2021-07-12 NOTE — Discharge Instructions (Signed)
Follow up with the neurologist for further evaluation.  Call to schedule an appointment

## 2021-08-22 ENCOUNTER — Other Ambulatory Visit: Payer: Self-pay

## 2021-08-22 ENCOUNTER — Emergency Department (HOSPITAL_COMMUNITY)
Admission: EM | Admit: 2021-08-22 | Discharge: 2021-08-23 | Disposition: A | Discharge status: Home / Self Care | Payer: Medicaid Other | Attending: Emergency Medicine | Admitting: Emergency Medicine

## 2021-08-22 ENCOUNTER — Encounter (HOSPITAL_COMMUNITY): Payer: Self-pay

## 2021-08-22 DIAGNOSIS — Z91040 Latex allergy status: Secondary | ICD-10-CM | POA: Diagnosis not present

## 2021-08-22 DIAGNOSIS — K6289 Other specified diseases of anus and rectum: Secondary | ICD-10-CM

## 2021-08-22 DIAGNOSIS — Z0441 Encounter for examination and observation following alleged adult rape: Secondary | ICD-10-CM | POA: Diagnosis not present

## 2021-08-22 DIAGNOSIS — K625 Hemorrhage of anus and rectum: Secondary | ICD-10-CM | POA: Insufficient documentation

## 2021-08-22 DIAGNOSIS — T7421XA Adult sexual abuse, confirmed, initial encounter: Secondary | ICD-10-CM | POA: Diagnosis present

## 2021-08-22 NOTE — ED Notes (Signed)
This RN spoke with SANE nurse.
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
## 2021-08-22 NOTE — ED Notes (Signed)
Sane Nurse Milo contacted at this time.

## 2021-08-23 ENCOUNTER — Ambulatory Visit (HOSPITAL_COMMUNITY)
Admission: EM | Admit: 2021-08-23 | Discharge: 2021-08-23 | Disposition: A | Discharge status: Home / Self Care | Payer: No Typology Code available for payment source | Source: Ambulatory Visit | Attending: Emergency Medicine | Admitting: Emergency Medicine

## 2021-08-23 DIAGNOSIS — Z0441 Encounter for examination and observation following alleged adult rape: Secondary | ICD-10-CM | POA: Insufficient documentation

## 2021-08-23 LAB — POC OCCULT BLOOD, ED: Fecal Occult Bld: NEGATIVE

## 2021-08-23 MED ORDER — HYDROCORTISONE ACETATE 25 MG RE SUPP: 25.0000 mg | Freq: Two times a day (BID) | RECTAL | 0 refills | Status: AC

## 2021-08-23 NOTE — ED Notes (Signed)
Pt taken to exam room by sane nurse
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
## 2021-08-23 NOTE — SANE Note (Signed)
Katherine Mack released to [place] [employer] at this time.
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
## 2022-02-09 ENCOUNTER — Other Ambulatory Visit: Payer: Self-pay

## 2022-02-09 ENCOUNTER — Emergency Department (HOSPITAL_COMMUNITY)
Admission: EM | Admit: 2022-02-09 | Discharge: 2022-02-09 | Disposition: A | Discharge status: Home / Self Care | Payer: Medicaid Other | Attending: Emergency Medicine | Admitting: Emergency Medicine

## 2022-02-09 DIAGNOSIS — G2409 Other drug induced dystonia: Secondary | ICD-10-CM | POA: Insufficient documentation

## 2022-02-09 DIAGNOSIS — Z91040 Latex allergy status: Secondary | ICD-10-CM | POA: Diagnosis not present

## 2022-02-09 DIAGNOSIS — R29898 Other symptoms and signs involving the musculoskeletal system: Secondary | ICD-10-CM | POA: Diagnosis present

## 2022-02-09 DIAGNOSIS — G2402 Drug induced acute dystonia: Secondary | ICD-10-CM

## 2022-02-09 MED ORDER — DIPHENHYDRAMINE HCL 25 MG PO CAPS
25.0000 mg | ORAL_CAPSULE | Freq: Once | ORAL | Status: AC
  Administered 2022-02-09: 25 mg via ORAL
  Filled 2022-02-09: qty 1

## 2022-02-09 NOTE — ED Provider Notes (Signed)
[facility] EMERGENCY DEPARTMENT Provider Note   CSN: [id number] Arrival date & time: 02/09/22  1641     History  Chief Complaint  Patient presents with   Medication Reaction    Katherine Mack is a 36 y.o. female.  Pt was seen yesterday at [facility] and diagnosed with a UTI.  Patient was given antibiotics and given Haldol for vomiting.  Patient reports today she had tightness in her mouth and her face.  She was seen again at [facility] and diagnosed with a dystonic reaction secondary to Haldol.  Patient reports that they gave her Benadryl and the symptoms resolved.  Patient reports when she returned home symptoms began again.  Patient received Benadryl here at triage and is currently asymptomatic.  Patient denies any other symptoms she does not have any fever she does not have any chills she is not currently having any vomiting she denies any diarrhea  The history is provided by the patient. No language interpreter was used.       Home Medications Prior to Admission medications   Medication Sig Start Date End Date Taking? Authorizing Provider  albuterol (PROVENTIL HFA;VENTOLIN HFA) 108 (90 Base) MCG/ACT inhaler Inhale 1-2 puffs into the lungs every 6 (six) hours as needed for wheezing or shortness of breath.    [provider]  amitriptyline (ELAVIL) 100 MG tablet Take 100 mg by mouth at bedtime. 07/04/21   [provider]  amoxicillin-clavulanate (AUGMENTIN) 875-125 MG tablet Take 1 tablet by mouth 2 (two) times daily. 07/04/21   [provider]  diphenhydrAMINE (BENADRYL) 25 mg capsule Take by mouth. 07/11/21   [provider]  FLUoxetine (PROZAC) 40 MG capsule Take 40 mg by mouth daily. 07/04/21   [provider]  hydrocortisone (ANUSOL-HC) 25 MG suppository Place 1 suppository (25 mg total) rectally 2 (two) times daily. For 7 days or until symptoms improve 08/23/21   Mesner, Corene Cornea, MD  ketorolac (TORADOL) 10 MG tablet Take 10 mg  by mouth every 6 (six) hours as needed. 07/11/21   [provider]  Multiple Vitamin (MULTIVITAMIN) tablet Take 1 tablet by mouth daily.    [provider]  ondansetron (ZOFRAN-ODT) 4 MG disintegrating tablet Take 4 mg by mouth every 8 (eight) hours as needed for nausea. 07/11/21   [provider]  phenol (CHLORASEPTIC) 1.4 % LIQD Use as directed 1 spray in the mouth or throat as needed for throat irritation / pain.    [provider]  Phenyleph-CPM-DM-APAP (ALKA-SELTZER PLUS COLD & FLU PO) Take 2 capsules by mouth every 6 (six) hours as needed (cough and congestion).    [provider]  sulfaSALAzine (AZULFIDINE) 500 MG tablet Take 1,000 mg by mouth every 8 (eight) hours. 07/05/21   [provider]  topiramate (TOPAMAX) 25 MG tablet Take 25 mg by mouth 2 (two) times daily. 07/04/21   [provider]  traZODone (DESYREL) 100 MG tablet Take 100 mg by mouth at bedtime. Take with 300 mg =400 mg    [provider]  trazodone (DESYREL) 300 MG tablet Take 300 mg by mouth at bedtime. Take with 100 mg = '400mg'$  07/04/21   [provider]      Allergies    Onion, Latex, Other, Paroxetine, Paxil [paroxetine hcl], Prednisone, and Propoxyphene    Review of Systems   Review of Systems  All other systems reviewed and are negative.   Physical Exam Updated Vital Signs BP (!) 158/112 (BP Location: Right Arm)  Pulse (!) 109   Temp 98.6 F (37 C) (Oral)   Resp 16   Wt 64.4 kg   LMP 05/21/2014 (Exact Date)   SpO2 98%   BMI 22.24 kg/m  Physical Exam Vitals and nursing note reviewed.  Constitutional:      General: She is not in acute distress.    Appearance: She is well-developed.  HENT:     Head: Normocephalic and atraumatic.     Nose: Nose normal.     Mouth/Throat:     Pharynx: Oropharynx is clear.  Eyes:     Conjunctiva/sclera: Conjunctivae normal.  Cardiovascular:     Rate and Rhythm: Normal rate and regular rhythm.      Heart sounds: No murmur heard. Pulmonary:     Effort: Pulmonary effort is normal. No respiratory distress.     Breath sounds: Normal breath sounds.  Abdominal:     Palpations: Abdomen is soft.     Tenderness: There is no abdominal tenderness.  Musculoskeletal:        General: No swelling.     Cervical back: Neck supple.  Skin:    General: Skin is warm and dry.     Capillary Refill: Capillary refill takes less than 2 seconds.  Neurological:     Mental Status: She is alert.  Psychiatric:        Mood and Affect: Mood normal.     ED Results / Procedures / Treatments   Labs (all labs ordered are listed, but only abnormal results are displayed) Labs Reviewed - No data to display  EKG None  Radiology No results found.  Procedures Procedures    Medications Ordered in ED Medications  diphenhydrAMINE (BENADRYL) capsule 25 mg (25 mg Oral Given 02/09/22 1702)    ED Course/ Medical Decision Making/ A&P                           Medical Decision Making Patient reports that she had facial tightness after being given Haldol yesterday.  Patient's symptoms have resolved with Benadryl  Amount and/or Complexity of Data Reviewed Independent Historian: spouse    Details: Patient is here with her spouse who is supportive  Risk OTC drugs. Risk Details: I observe the patient in the emergency department for 2 hours her symptoms did not recur.  I have advised her to take Benadryl 25 mg every 4 hours for the next 48 hours.  Patient is advised to avoid Haldol in the future.  Patient is discharged in stable condition           Final Clinical Impression(s) / ED Diagnoses Final diagnoses:  Dystonic drug reaction    Rx / DC Orders ED Discharge Orders     None      An After Visit Summary was printed and given to the patient.    Fransico Meadow, PA-C 02/09/22 2232    Audley Hose, MD 02/09/22 [phone]

## 2022-02-09 NOTE — ED Notes (Signed)
Dispo completed by provider

## 2022-02-09 NOTE — Discharge Instructions (Signed)
Take benadryl '25mg'$  every 4 hours for the next 2 days.  Your next dose is at 9:00pm

## 2022-02-09 NOTE — ED Triage Notes (Signed)
Pt was seen at [facility] [place] yesterday and DX with UTI. Pt states she was given keflex and haldol for abd pain and infection. Today pt started with slurred speech and neck pain and jaw movement. 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 Pt also received head CT and did not have a stroke. Pt was given benadryl while in the ED and symptoms improved. Pt was discharged home and after getting home experienced the same symptoms. Pt is alert and oriented in triage. Pt has slurred speech with mouth twisted to the side and head down. Pt complains of neck pain. EDP made aware and stated to give benadryl.
